# Patient Record
Sex: Male | Born: 1980
Health system: Southern US, Community
[De-identification: ages and names within clinical notes are randomized; demographics above are authoritative.]

## PROBLEM LIST (undated history)

## (undated) DIAGNOSIS — F32A Depression, unspecified: Secondary | ICD-10-CM

## (undated) DIAGNOSIS — F329 Major depressive disorder, single episode, unspecified: Secondary | ICD-10-CM

## (undated) DIAGNOSIS — F909 Attention-deficit hyperactivity disorder, unspecified type: Secondary | ICD-10-CM

## (undated) DIAGNOSIS — Z9852 Vasectomy status: Secondary | ICD-10-CM

## (undated) DIAGNOSIS — F419 Anxiety disorder, unspecified: Secondary | ICD-10-CM

## (undated) DIAGNOSIS — I10 Essential (primary) hypertension: Secondary | ICD-10-CM

## (undated) DIAGNOSIS — T7840XA Allergy, unspecified, initial encounter: Secondary | ICD-10-CM

## (undated) HISTORY — DX: Allergy, unspecified, initial encounter: T78.40XA

## (undated) HISTORY — DX: Attention-deficit hyperactivity disorder, unspecified type: F90.9

## (undated) HISTORY — DX: Anxiety disorder, unspecified: F41.9

## (undated) HISTORY — DX: Essential (primary) hypertension: I10

## (undated) HISTORY — DX: Major depressive disorder, single episode, unspecified: F32.9

## (undated) HISTORY — DX: Depression, unspecified: F32.A

## (undated) HISTORY — PX: VASECTOMY: SHX75

---
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
## 2001-08-19 ENCOUNTER — Emergency Department (HOSPITAL_COMMUNITY): Admission: EM | Admit: 2001-08-19 | Discharge: 2001-08-20 | Payer: Self-pay | Admitting: Emergency Medicine

## 2001-09-06 ENCOUNTER — Emergency Department (HOSPITAL_COMMUNITY): Admission: EM | Admit: 2001-09-06 | Discharge: 2001-09-06 | Payer: Self-pay | Admitting: Emergency Medicine

## 2002-01-08 ENCOUNTER — Encounter: Payer: Self-pay | Admitting: Emergency Medicine

## 2002-01-08 ENCOUNTER — Emergency Department (HOSPITAL_COMMUNITY): Admission: EM | Admit: 2002-01-08 | Discharge: 2002-01-09 | Payer: Self-pay | Admitting: Emergency Medicine

## 2002-01-09 ENCOUNTER — Emergency Department (HOSPITAL_COMMUNITY): Admission: EM | Admit: 2002-01-09 | Discharge: 2002-01-09 | Payer: Self-pay | Admitting: Emergency Medicine

## 2005-08-08 ENCOUNTER — Emergency Department (HOSPITAL_COMMUNITY): Admission: EM | Admit: 2005-08-08 | Discharge: 2005-08-09 | Payer: Self-pay | Admitting: Emergency Medicine

## 2007-10-28 ENCOUNTER — Emergency Department (HOSPITAL_COMMUNITY): Admission: EM | Admit: 2007-10-28 | Discharge: 2007-10-28 | Payer: Self-pay | Admitting: Emergency Medicine

## 2008-07-08 ENCOUNTER — Ambulatory Visit: Payer: Self-pay | Admitting: Family Medicine

## 2008-07-08 DIAGNOSIS — N411 Chronic prostatitis: Secondary | ICD-10-CM

## 2008-07-08 DIAGNOSIS — Z87448 Personal history of other diseases of urinary system: Secondary | ICD-10-CM | POA: Insufficient documentation

## 2008-07-08 HISTORY — DX: Chronic prostatitis: N41.1

## 2008-07-08 LAB — CONVERTED CEMR LAB
Bilirubin Urine: NEGATIVE
Glucose, Urine, Semiquant: NEGATIVE
Ketones, urine, test strip: NEGATIVE
Nitrite: NEGATIVE
Protein, U semiquant: NEGATIVE
Specific Gravity, Urine: 1.015
Urobilinogen, UA: 0.2
WBC Urine, dipstick: NEGATIVE
pH: 7

## 2008-09-02 ENCOUNTER — Ambulatory Visit: Payer: Self-pay | Admitting: Family Medicine

## 2008-09-02 LAB — CONVERTED CEMR LAB
Bilirubin Urine: NEGATIVE
Blood in Urine, dipstick: NEGATIVE
Glucose, Urine, Semiquant: NEGATIVE
Ketones, urine, test strip: NEGATIVE
Nitrite: NEGATIVE
Protein, U semiquant: NEGATIVE
Specific Gravity, Urine: 1.015
Urobilinogen, UA: 0.2
WBC Urine, dipstick: NEGATIVE
pH: 6

## 2009-08-01 ENCOUNTER — Ambulatory Visit: Payer: Self-pay | Admitting: Family Medicine

## 2013-02-04 ENCOUNTER — Emergency Department (HOSPITAL_COMMUNITY)
Admission: EM | Admit: 2013-02-04 | Discharge: 2013-02-04 | Disposition: A | Discharge status: Home / Self Care | Payer: BC Managed Care – PPO | Attending: Emergency Medicine | Admitting: Emergency Medicine

## 2013-02-04 ENCOUNTER — Emergency Department (HOSPITAL_COMMUNITY): Payer: BC Managed Care – PPO

## 2013-02-04 ENCOUNTER — Encounter (HOSPITAL_COMMUNITY): Payer: Self-pay | Admitting: Emergency Medicine

## 2013-02-04 DIAGNOSIS — S139XXA Sprain of joints and ligaments of unspecified parts of neck, initial encounter: Secondary | ICD-10-CM | POA: Insufficient documentation

## 2013-02-04 DIAGNOSIS — M549 Dorsalgia, unspecified: Secondary | ICD-10-CM

## 2013-02-04 DIAGNOSIS — Y939 Activity, unspecified: Secondary | ICD-10-CM | POA: Insufficient documentation

## 2013-02-04 DIAGNOSIS — Y92410 Unspecified street and highway as the place of occurrence of the external cause: Secondary | ICD-10-CM | POA: Insufficient documentation

## 2013-02-04 DIAGNOSIS — S161XXA Strain of muscle, fascia and tendon at neck level, initial encounter: Secondary | ICD-10-CM
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
## 2013-02-05 NOTE — ED Provider Notes (Signed)
Medical screening examination/treatment/procedure(s) were performed by non-physician practitioner and as supervising physician I was immediately available for consultation/collaboration.   Dione Booze, MD 02/05/13 [phone]

## 2017-07-28 ENCOUNTER — Other Ambulatory Visit: Payer: Self-pay

## 2017-07-28 ENCOUNTER — Encounter (HOSPITAL_COMMUNITY): Payer: Self-pay | Admitting: *Deleted

## 2017-07-28 ENCOUNTER — Emergency Department (HOSPITAL_COMMUNITY)
Admission: EM | Admit: 2017-07-28 | Discharge: 2017-07-28 | Disposition: A | Discharge status: Home / Self Care | Payer: 59 | Attending: Emergency Medicine | Admitting: Emergency Medicine

## 2017-07-28 ENCOUNTER — Emergency Department (HOSPITAL_COMMUNITY): Payer: 59

## 2017-07-28 DIAGNOSIS — B349 Viral infection, unspecified: Secondary | ICD-10-CM | POA: Insufficient documentation

## 2017-07-28 DIAGNOSIS — Z87891 Personal history of nicotine dependence: Secondary | ICD-10-CM | POA: Diagnosis not present

## 2017-07-28 DIAGNOSIS — R42 Dizziness and giddiness: Secondary | ICD-10-CM | POA: Diagnosis present

## 2017-07-28 DIAGNOSIS — Z79899 Other long term (current) drug therapy: Secondary | ICD-10-CM | POA: Insufficient documentation

## 2017-07-28 DIAGNOSIS — M545 Low back pain: Secondary | ICD-10-CM | POA: Diagnosis not present

## 2017-07-28 LAB — CBC WITH DIFFERENTIAL/PLATELET
Basophils Absolute: 0 10*3/uL (ref 0.0–0.1)
Basophils Relative: 1 %
EOS PCT: 2 %
Eosinophils Absolute: 0.2 10*3/uL (ref 0.0–0.7)
HCT: 44.8 % (ref 39.0–52.0)
Hemoglobin: 15.3 g/dL (ref 13.0–17.0)
LYMPHS ABS: 1.8 10*3/uL (ref 0.7–4.0)
LYMPHS PCT: 24 %
MCH: 29.7 pg (ref 26.0–34.0)
MCHC: 34.2 g/dL (ref 30.0–36.0)
MCV: 87 fL (ref 78.0–100.0)
MONO ABS: 0.3 10*3/uL (ref 0.1–1.0)
Monocytes Relative: 5 %
Neutro Abs: 4.9 10*3/uL (ref 1.7–7.7)
Neutrophils Relative %: 68 %
PLATELETS: 299 10*3/uL (ref 150–400)
RBC: 5.15 MIL/uL (ref 4.22–5.81)
RDW: 12.6 % (ref 11.5–15.5)
WBC: 7.2 10*3/uL (ref 4.0–10.5)

## 2017-07-28 LAB — COMPREHENSIVE METABOLIC PANEL
ALT: 34 U/L (ref 17–63)
ANION GAP: 9 (ref 5–15)
AST: 24 U/L (ref 15–41)
Albumin: 4.7 g/dL (ref 3.5–5.0)
Alkaline Phosphatase: 59 U/L (ref 38–126)
BUN: 12 mg/dL (ref 6–20)
CHLORIDE: 102 mmol/L (ref 101–111)
CO2: 28 mmol/L (ref 22–32)
CREATININE: 1.13 mg/dL (ref 0.61–1.24)
Calcium: 9.3 mg/dL (ref 8.9–10.3)
Glucose, Bld: 97 mg/dL (ref 65–99)
POTASSIUM: 3.8 mmol/L (ref 3.5–5.1)
SODIUM: 139 mmol/L (ref 135–145)
Total Bilirubin: 0.6 mg/dL (ref 0.3–1.2)
Total Protein: 7.8 g/dL (ref 6.5–8.1)

## 2017-07-28 LAB — URINALYSIS, ROUTINE W REFLEX MICROSCOPIC
BILIRUBIN URINE: NEGATIVE
Bacteria, UA: NONE SEEN
Glucose, UA: NEGATIVE mg/dL
HGB URINE DIPSTICK: NEGATIVE
Ketones, ur: NEGATIVE mg/dL
LEUKOCYTES UA: NEGATIVE
NITRITE: NEGATIVE
PROTEIN: 30 mg/dL — AB
RBC / HPF: NONE SEEN RBC/hpf (ref 0–5)
Specific Gravity, Urine: 1.026 (ref 1.005–1.030)
Squamous Epithelial / LPF: NONE SEEN
WBC UA: NONE SEEN WBC/hpf (ref 0–5)
pH: 6 (ref 5.0–8.0)

## 2017-07-28 MED ORDER — NAPROXEN 500 MG PO TABS: ORAL_TABLET | ORAL | 0 refills | Status: DC

## 2017-07-28 MED ORDER — ACETAMINOPHEN 500 MG PO TABS
ORAL_TABLET | ORAL | Status: AC
  Filled 2017-07-28: qty 2

## 2017-07-28 MED ORDER — MECLIZINE HCL 25 MG PO TABS: 25.0000 mg | ORAL_TABLET | Freq: Three times a day (TID) | ORAL | 0 refills | Status: DC | PRN

## 2017-07-28 MED ORDER — ACETAMINOPHEN 500 MG PO TABS
1000.0000 mg | ORAL_TABLET | Freq: Once | ORAL | Status: AC
  Administered 2017-07-28: 1000 mg via ORAL

## 2017-07-28 NOTE — ED Triage Notes (Addendum)
Pt c/o intermittent lower back pain, dizziness, nausea, cold sweats x 2 days ago. Denies urinary symptoms, vomiting.

## 2017-07-28 NOTE — ED Notes (Signed)
Pt c/o headache.  Dr. Roderic Palau notified.  Orders received.

## 2017-07-28 NOTE — ED Provider Notes (Signed)
[facility] EMERGENCY DEPARTMENT Provider Note   CSN: [id number] Arrival date & time: 07/28/17  1205     History   Chief Complaint Chief Complaint  Patient presents with  . Dizziness  . Back Pain    HPI Alejandro Johnson is a 36 y.o. male.  Patient complains of feeling dizzy when he moves his head from side to side also complains of just some weakness.  No nausea no vomiting no fever.  Patient also has mild back pain   The history is provided by the patient.  Dizziness  Quality:  Lightheadedness and vertigo Severity:  Mild Onset quality:  Sudden Timing:  Intermittent Progression:  Unchanged Chronicity:  New Context: not when bending over   Relieved by:  Nothing Worsened by:  Nothing Associated symptoms: no chest pain, no diarrhea and no headaches   Back Pain   Pertinent negatives include no chest pain, no headaches and no abdominal pain.    History reviewed. No pertinent past medical history.  Patient Active Problem List   Diagnosis Date Noted  . CHRONIC PROSTATITIS 07/08/2008  . DYSURIA, HX OF 07/08/2008    History reviewed. No pertinent surgical history.     Home Medications    Prior to Admission medications   Medication Sig Start Date End Date Taking? Authorizing Provider  amphetamine-dextroamphetamine (ADDERALL) 10 MG tablet Take 10 mg daily with breakfast by mouth.   Yes [provider]  ibuprofen (ADVIL,MOTRIN) 200 MG tablet Take 400 mg every 6 (six) hours as needed by mouth for headache.   Yes [provider]  loratadine (CLARITIN) 10 MG tablet Take 10 mg daily by mouth.   Yes [provider]  meclizine (ANTIVERT) 25 MG tablet Take 1 tablet (25 mg total) 3 (three) times daily as needed by mouth for dizziness. 07/28/17   Milton Ferguson, MD  naproxen (NAPROSYN) 500 MG tablet Take one twice a day as needed for pain 07/28/17   Milton Ferguson, MD    Family History No family history on file.  Social History Social History    Tobacco Use  . Smoking status: Former Research scientist (life sciences)  . Smokeless tobacco: Never Used  Substance Use Topics  . Alcohol use: Yes    Comment: once weekly   . Drug use: No     Allergies   Codeine   Review of Systems Review of Systems  Constitutional: Negative for appetite change and fatigue.  HENT: Negative for congestion, ear discharge and sinus pressure.   Eyes: Negative for discharge.  Respiratory: Negative for cough.   Cardiovascular: Negative for chest pain.  Gastrointestinal: Negative for abdominal pain and diarrhea.  Genitourinary: Negative for frequency and hematuria.  Musculoskeletal: Positive for back pain.  Skin: Negative for rash.  Neurological: Positive for dizziness. Negative for seizures and headaches.  Psychiatric/Behavioral: Negative for hallucinations.     Physical Exam Updated Vital Signs BP 136/88   Pulse 70   Temp 98.4 F (36.9 C) (Oral)   Resp 16   Ht 6' 1.75" (1.873 m)   Wt 93 kg (205 lb)   SpO2 95%   BMI 26.50 kg/m   Physical Exam  Constitutional: He is oriented to person, place, and time. He appears well-developed.  HENT:  Head: Normocephalic.  Eyes: Conjunctivae and EOM are normal. No scleral icterus.  Neck: Neck supple. No thyromegaly present.  Cardiovascular: Normal rate and regular rhythm. Exam reveals no gallop and no friction rub.  No murmur heard. Pulmonary/Chest: No stridor. He has no wheezes. He  has no rales. He exhibits no tenderness.  Abdominal: He exhibits no distension. There is no tenderness. There is no rebound.  Musculoskeletal: Normal range of motion. He exhibits no edema.  Lymphadenopathy:    He has no cervical adenopathy.  Neurological: He is oriented to person, place, and time. He exhibits normal muscle tone. Coordination normal.  Skin: No rash noted. No erythema.  Psychiatric: He has a normal mood and affect. His behavior is normal.     ED Treatments / Results  Labs (all labs ordered are listed, but only abnormal  results are displayed) Labs Reviewed  URINALYSIS, ROUTINE W REFLEX MICROSCOPIC - Abnormal; Notable for the following components:      Result Value   Protein, ur 30 (*)    All other components within normal limits  CBC WITH DIFFERENTIAL/PLATELET  COMPREHENSIVE METABOLIC PANEL    EKG  EKG Interpretation None       Radiology Dg Chest 2 View  Result Date: 07/28/2017 CLINICAL DATA:  Lower back pain. EXAM: CHEST  2 VIEW COMPARISON:  Radiographs of August 08, 2005. FINDINGS: The heart size and mediastinal contours are within normal limits. Both lungs are clear. No pneumothorax or pleural effusion is noted. The visualized skeletal structures are unremarkable. IMPRESSION: No active cardiopulmonary disease. Electronically Signed   By: Marijo Conception, M.D.   On: 07/28/2017 16:55   Dg Lumbar Spine Complete  Result Date: 07/28/2017 CLINICAL DATA:  Low back pain. EXAM: LUMBAR SPINE - COMPLETE 4+ VIEW COMPARISON:  Radiographs of Feb 04, 2013. FINDINGS: There is no evidence of lumbar spine fracture. Alignment is normal. Intervertebral disc spaces are maintained. IMPRESSION: Normal lumbar spine. Electronically Signed   By: Marijo Conception, M.D.   On: 07/28/2017 16:56    Procedures Procedures (including critical care time)  Medications Ordered in ED Medications  acetaminophen (TYLENOL) tablet 1,000 mg (1,000 mg Oral Given 07/28/17 1740)     Initial Impression / Assessment and Plan / ED Course  I have reviewed the triage vital signs and the nursing notes.  Pertinent labs & imaging results that were available during my care of the patient were reviewed by me and considered in my medical decision making (see chart for details).     Labs unremarkable x-rays are unremarkable.  Suspect viral syndrome patient will be given Antivert and Naprosyn and will follow-up next week with a new PCP  Final Clinical Impressions(s) / ED Diagnoses   Final diagnoses:  Viral syndrome    ED Discharge  Orders        Ordered    meclizine (ANTIVERT) 25 MG tablet  3 times daily PRN     07/28/17 1811    naproxen (NAPROSYN) 500 MG tablet     07/28/17 1811       Milton Ferguson, MD 07/28/17 1819

## 2017-07-28 NOTE — Discharge Instructions (Signed)
Follow-up with your doctor as planned Tuesday.

## 2017-08-02 ENCOUNTER — Ambulatory Visit: Payer: Self-pay | Admitting: Family Medicine

## 2017-08-08 ENCOUNTER — Ambulatory Visit: Payer: Self-pay | Admitting: Family Medicine

## 2017-10-03 ENCOUNTER — Ambulatory Visit: Payer: Self-pay | Admitting: Family Medicine

## 2017-10-14 ENCOUNTER — Ambulatory Visit (INDEPENDENT_AMBULATORY_CARE_PROVIDER_SITE_OTHER): Payer: No Typology Code available for payment source | Admitting: Family Medicine

## 2017-10-14 ENCOUNTER — Encounter: Payer: Self-pay | Admitting: Family Medicine

## 2017-10-14 ENCOUNTER — Other Ambulatory Visit: Payer: Self-pay

## 2017-10-14 VITALS — BP 139/91 | HR 73 | Temp 97.7°F | Resp 18 | Ht 71.93 in | Wt 202.0 lb

## 2017-10-14 DIAGNOSIS — F418 Other specified anxiety disorders: Secondary | ICD-10-CM

## 2017-10-14 DIAGNOSIS — Z1329 Encounter for screening for other suspected endocrine disorder: Secondary | ICD-10-CM | POA: Diagnosis not present

## 2017-10-14 MED ORDER — FLUOXETINE HCL 20 MG PO TABS
20.0000 mg | ORAL_TABLET | Freq: Every day | ORAL | 3 refills | Status: DC
Start: 2017-10-14 — End: 2017-11-21

## 2017-10-14 MED FILL — FLUoxetine HCL 20 MG TABS: 20 | 30 days supply | Qty: 30 | Fill #0

## 2017-10-14 NOTE — Patient Instructions (Addendum)
Start fluoxetine once per day. Continue follow-up with therapist and start exercise most days per week. Follow-up in 3-4 weeks, but can update me by my chart in the next 2 weeks. Also can follow-up to discuss the bumps under the skin at next visit.  Return to the clinic or go to the nearest emergency room if any of your symptoms worsen or new symptoms occur.   Major Depressive Disorder, Adult Major depressive disorder (MDD) is a mental health condition. MDD often makes you feel sad, hopeless, or helpless. MDD can also cause symptoms in your body. MDD can affect your:  Work.  School.  Relationships.  Other normal activities.  MDD can range from mild to very bad. It may occur once (single episode MDD). It can also occur many times (recurrent MDD). The main symptoms of MDD often include:  Feeling sad, depressed, or irritable most of the time.  Loss of interest.  MDD symptoms also include:  Sleeping too much or too little.  Eating too much or too little.  A change in your weight.  Feeling tired (fatigue) or having low energy.  Feeling worthless.  Feeling guilty.  Trouble making decisions.  Trouble thinking clearly.  Thoughts of suicide or harming others.  Feeling weak.  Feeling agitated.  Keeping yourself from being around other people (isolation).  Follow these instructions at home: Activity  Do these things as told by your doctor: ? Go back to your normal activities. ? Exercise regularly. ? Spend time outdoors. Alcohol  Talk with your doctor about how alcohol can affect your antidepressant medicines.  Do not drink alcohol. Or, limit how much alcohol you drink. ? This means no more than 1 drink a day for nonpregnant women and 2 drinks a day for men. One drink equals one of these:  12 oz of beer.  5 oz of wine.  1 oz of hard liquor. General instructions  Take over-the-counter and prescription medicines only as told by your doctor.  Eat a healthy  diet.  Get plenty of sleep.  Find activities that you enjoy. Make time to do them.  Think about joining a support group. Your doctor may be able to suggest a group for you.  Keep all follow-up visits as told by your doctor. This is important. Where to find more information:  [employer] on Mental Illness: ? www.nami.[place]: ? https://carter.com/  National Suicide Prevention Lifeline: ? 865-510-7309. This is free, 24-hour help. Contact a doctor if:  Your symptoms get worse.  You have new symptoms. Get help right away if:  You self-harm.  You see, hear, taste, smell, or feel things that are not present (hallucinate). If you ever feel like you may hurt yourself or others, or have thoughts about taking your own life, get help right away. You can go to your nearest emergency department or call:  Your local emergency services (911 in the [place]  A suicide crisis helpline, such as the National Suicide Prevention Lifeline: ? 501-836-3670. This is open 24 hours a day.  This information is not intended to replace advice given to you by your health care provider. Make sure you discuss any questions you have with your health care provider. Document Released: 08/11/2015 Document Revised: 05/16/2016 Document Reviewed: 05/16/2016 Elsevier Interactive Patient Education  2017 [employer].   IF you received an x-ray today, you will receive an invoice from [facility]. Please contact [facility] at [phone] with questions or concerns regarding your  invoice.   IF you received labwork today, you will receive an invoice from [facility]. Please contact [facility] at [phone] with questions or concerns regarding your invoice.   Our billing staff will not be able to assist you with questions regarding bills from these companies.  You will be contacted with the lab results as soon as they are available. The fastest way to get  your results is to activate your My Chart account. Instructions are located on the last page of this paperwork. If you have not heard from [place] regarding the results in 2 weeks, please contact this office.

## 2017-10-14 NOTE — Progress Notes (Addendum)
Subjective:    Patient ID: Alejandro R Meter, male    DOB: 1980/12/27, 37 y.o.   MRN: [id number]  HPI Alejandro R Jarmon is a 37 y.o. male Presents today for: Chief Complaint  Patient presents with  . Establish Care  . Depression    screening done and anxiety   Here to establish care. Change in insurance. No current prescription meds.   Positive depression screening on intake including suicidal ideation as below.  Has been seeing therapist for past 2-3 months. [facility].  Has been diagnosed with depression and anxiety , with self esteem issues.  Took Zoloft for these sx's in 2015. On and off that med in past, but did not feel like it was that helpful. No suicide attempts. Has had thoughts of suicide prior to meeting with therapist, but no plan.  No recent suicide thoughts. No recent exercise, prior basketball few times per week in the fall. One close friend in the area. Family local in [place]. Barrier to counseling previously was initially financially, but current counseling is covered.   No recent ADD meds. No formal eval.   No known Bipolar D/O in family. Mom with depression - unknown   Legal issues between [age]. 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 Last used 2003-2004. No recent alcohol - prior up to one bottle of wine per week. No issues with alcohol recently.   Works from home - [employer]. Married, recenlty found out wife is pregnant, surprise but ok with this. Has 3 and 38 yo boys.    Depression screen PHQ 2/9 10/14/2017  Decreased Interest 3  Down, Depressed, Hopeless 3  PHQ - 2 Score 6  Altered sleeping 3  Tired, decreased energy 3  Change in appetite 1  Feeling bad or failure about yourself  3  Trouble concentrating 3  Moving slowly or fidgety/restless 1  Suicidal thoughts 2  PHQ-9 Score 22  Difficult doing work/chores Very difficult      Patient Active Problem List   Diagnosis Date Noted  . CHRONIC PROSTATITIS 07/08/2008  . DYSURIA, HX OF 07/08/2008   Past Medical  History:  Diagnosis Date  . Allergy   . Anxiety   . Depression    History reviewed. No pertinent surgical history. Allergies  Allergen Reactions  . Codeine Itching and Rash   Prior to Admission medications   Medication Sig Start Date End Date Taking? Authorizing Provider  ibuprofen (ADVIL,MOTRIN) 200 MG tablet Take 400 mg every 6 (six) hours as needed by mouth for headache.   Yes [provider]  loratadine (CLARITIN) 10 MG tablet Take 10 mg daily by mouth.   Yes [provider]  amphetamine-dextroamphetamine (ADDERALL) 10 MG tablet Take 10 mg daily with breakfast by mouth.    [provider]  meclizine (ANTIVERT) 25 MG tablet Take 1 tablet (25 mg total) 3 (three) times daily as needed by mouth for dizziness. Patient not taking: Reported on 10/14/2017 07/28/17   Milton Ferguson, MD  naproxen (NAPROSYN) 500 MG tablet Take one twice a day as needed for pain Patient not taking: Reported on 10/14/2017 07/28/17   Milton Ferguson, MD   Social History   Socioeconomic History  . Marital status: Married    Spouse name: [name]  . Number of children: 2  . Years of education: Not on file  . Highest education level: Not on file  Social Needs  . Financial resource strain: Not on file  . Food insecurity - worry: Not on file  .  Food insecurity - inability: Not on file  . Transportation needs - medical: Not on file  . Transportation needs - non-medical: Not on file  Occupational History  . Not on file  Tobacco Use  . Smoking status: Former Smoker    Types: Cigarettes  . Smokeless tobacco: Never Used  Substance and Sexual Activity  . Alcohol use: Yes    Comment: once weekly   . Drug use: No  . Sexual activity: Yes  Other Topics Concern  . Not on file  Social History Narrative  . Not on file    Review of Systems    As above.  Objective:   Physical Exam  Constitutional: He is oriented to person, place, and time. He appears well-developed and well-nourished.    HENT:  Head: Normocephalic and atraumatic.  Eyes: EOM are normal. Pupils are equal, round, and reactive to light.  Neck: No JVD present. Carotid bruit is not present.  Cardiovascular: Normal rate, regular rhythm and normal heart sounds.  No murmur heard. Pulmonary/Chest: Effort normal and breath sounds normal. He has no rales.  Musculoskeletal: He exhibits no edema.  Neurological: He is alert and oriented to person, place, and time.  Skin: Skin is warm and dry.  Psychiatric: He has a normal mood and affect. His behavior is normal. Thought content normal.  Good eye contact, normal responses.  Vitals reviewed.   Vitals:   10/14/17 1102  BP: (!) 140/95  Pulse: 73  Resp: 18  Temp: 97.7 F (36.5 C)  TempSrc: Oral  SpO2: 98%  Weight: 202 lb (91.6 kg)  Height: 5' 11.93" (1.827 m)    Over 20 minutes total face-to-face care, greater than 50% counseling     Assessment & Plan:    Alejandro Johnson is a 37 y.o. male Depression with anxiety - Plan: TSH, FLUoxetine (PROZAC) 20 MG tablet  Screening for thyroid disorder - Plan: TSH Suspect long-standing depression, recent worsening but now with care of therapist. Denies recent suicidal ideation. ER/911 precautions discussed if return of those symptoms.   - Start Prozac 20 mg daily potential side effects discussed, continue therapy, exercise most days per week, recheck 3-4 weeks.  At end of visit he has some concerns regarding long-standing bumps under the skin. Plans to follow-up to discuss this further.  Meds ordered this encounter  Medications  . FLUoxetine (PROZAC) 20 MG tablet    Sig: Take 1 tablet (20 mg total) by mouth daily.    Dispense:  30 tablet    Refill:  3   Patient Instructions   Start fluoxetine once per day. Continue follow-up with therapist and start exercise most days per week. Follow-up in 3-4 weeks, but can update me by my chart in the next 2 weeks. Also can follow-up to discuss the bumps under the skin at next  visit.  Return to the clinic or go to the nearest emergency room if any of your symptoms worsen or new symptoms occur.   Major Depressive Disorder, Adult Major depressive disorder (MDD) is a mental health condition. MDD often makes you feel sad, hopeless, or helpless. MDD can also cause symptoms in your body. MDD can affect your:  Work.  School.  Relationships.  Other normal activities.  MDD can range from mild to very bad. It may occur once (single episode MDD). It can also occur many times (recurrent MDD). The main symptoms of MDD often include:  Feeling sad, depressed, or irritable most of the time.  Loss of interest.  MDD symptoms also include:  Sleeping too much or too little.  Eating too much or too little.  A change in your weight.  Feeling tired (fatigue) or having low energy.  Feeling worthless.  Feeling guilty.  Trouble making decisions.  Trouble thinking clearly.  Thoughts of suicide or harming others.  Feeling weak.  Feeling agitated.  Keeping yourself from being around other people (isolation).  Follow these instructions at home: Activity  Do these things as told by your doctor: ? Go back to your normal activities. ? Exercise regularly. ? Spend time outdoors. Alcohol  Talk with your doctor about how alcohol can affect your antidepressant medicines.  Do not drink alcohol. Or, limit how much alcohol you drink. ? This means no more than 1 drink a day for nonpregnant women and 2 drinks a day for men. One drink equals one of these:  12 oz of beer.  5 oz of wine.  1 oz of hard liquor. General instructions  Take over-the-counter and prescription medicines only as told by your doctor.  Eat a healthy diet.  Get plenty of sleep.  Find activities that you enjoy. Make time to do them.  Think about joining a support group. Your doctor may be able to suggest a group for you.  Keep all follow-up visits as told by your doctor. This is  important. Where to find more information:  [employer] on Mental Illness: ? www.nami.[place]: ? https://carter.com/  National Suicide Prevention Lifeline: ? 640-356-2884. This is free, 24-hour help. Contact a doctor if:  Your symptoms get worse.  You have new symptoms. Get help right away if:  You self-harm.  You see, hear, taste, smell, or feel things that are not present (hallucinate). If you ever feel like you may hurt yourself or others, or have thoughts about taking your own life, get help right away. You can go to your nearest emergency department or call:  Your local emergency services (911 in the [place]  A suicide crisis helpline, such as the National Suicide Prevention Lifeline: ? (772) 059-8774. This is open 24 hours a day.  This information is not intended to replace advice given to you by your health care provider. Make sure you discuss any questions you have with your health care provider. Document Released: 08/11/2015 Document Revised: 05/16/2016 Document Reviewed: 05/16/2016 Elsevier Interactive Patient Education  2017 [employer].   IF you received an x-ray today, you will receive an invoice from [facility] Radiology. Please contact [facility] Radiology at [phone] with questions or concerns regarding your invoice.   IF you received labwork today, you will receive an invoice from [facility]. Please contact [facility] at [phone] with questions or concerns regarding your invoice.   Our billing staff will not be able to assist you with questions regarding bills from these companies.  You will be contacted with the lab results as soon as they are available. The fastest way to get your results is to activate your My Chart account. Instructions are located on the last page of this paperwork. If you have not heard from [place] regarding the results in 2 weeks, please contact this office.      Signed,   [name], MD Primary Care at [place].  10/14/17 12:10 PM

## 2017-10-14 NOTE — Addendum Note (Signed)
Addended by: [name] R on: 10/14/2017 12:13 PM   Modules accepted: Orders

## 2017-10-15 LAB — TSH: TSH: 1.49 u[IU]/mL (ref 0.450–4.500)

## 2017-11-14 ENCOUNTER — Ambulatory Visit: Payer: No Typology Code available for payment source | Admitting: Family Medicine

## 2017-11-21 ENCOUNTER — Encounter: Payer: Self-pay | Admitting: Family Medicine

## 2017-11-21 ENCOUNTER — Other Ambulatory Visit: Payer: Self-pay

## 2017-11-21 ENCOUNTER — Ambulatory Visit (INDEPENDENT_AMBULATORY_CARE_PROVIDER_SITE_OTHER): Payer: No Typology Code available for payment source | Admitting: Family Medicine

## 2017-11-21 DIAGNOSIS — F418 Other specified anxiety disorders: Secondary | ICD-10-CM | POA: Diagnosis not present

## 2017-11-21 MED ORDER — FLUOXETINE HCL 20 MG PO TABS
40.0000 mg | ORAL_TABLET | Freq: Every day | ORAL | 1 refills | Status: DC
Start: 2017-11-21 — End: 2018-02-17

## 2017-11-21 MED FILL — FLUoxetine HCL 40 MG CAPS: 40 | 90 days supply | Qty: 90 | Fill #0

## 2017-11-21 NOTE — Patient Instructions (Addendum)
Keep follow up with therapist.   As cough improves, restart exercise to also help with depression.  Increase Prozac to 40mg  per day.  Recheck in 3 months - let me know if there are questions in the meantime. Thanks for coming in today.    IF you received an x-ray today, you will receive an invoice from [facility] Radiology. Please contact [facility] Radiology at [phone] with questions or concerns regarding your invoice.   IF you received labwork today, you will receive an invoice from [place]. Please contact [facility] at [phone] with questions or concerns regarding your invoice.   Our billing staff will not be able to assist you with questions regarding bills from these companies.  You will be contacted with the lab results as soon as they are available. The fastest way to get your results is to activate your My Chart account. Instructions are located on the last page of this paperwork. If you have not heard from [place] regarding the results in 2 weeks, please contact this office.

## 2017-11-21 NOTE — Progress Notes (Addendum)
Subjective:  This chart was scribed for Wendie Agreste, MD by Tamsen Roers, at [place] at [facility].  This patient was seen in room 11  and the patient's care was started at 10:23 AM.   Chief Complaint  Patient presents with  . Depression    follow up screening was an 63      Patient ID: Alejandro Johnson, male    DOB: July 28, 1981, 37 y.o.   MRN: [id number]  HPI HPI Comments: Alejandro Johnson is a 37 y.o. male who presents to Primary Care at [facility] for follow up on depression.  Last seen February 1st. Started on Prozac 20 mg QD. Recommended follow up with therapist and exercise most days of the week. See other details from that visit. Normal TSH at last visit.  --- Patient has not noticed much change after taking the medication and denies any side effects. He denies any thoughts of suicide as he has been talking often with his therapist and wife (was talking to his therapist about twice a week) who have been helping him with these thoughts. He has had the flu (still has a cough) so he was not able to meet with the therapist for a couple days or exercise as he was planning on doing.  Patients wife is currently pregnant.   Depression screen [facility] 2/9 11/21/2017 10/14/2017  Decreased Interest 3 3  Down, Depressed, Hopeless 3 3  PHQ - 2 Score 6 6  Altered sleeping 2 3  Tired, decreased energy 2 3  Change in appetite 0 1  Feeling bad or failure about yourself  2 3  Trouble concentrating 3 3  Moving slowly or fidgety/restless 3 1  Suicidal thoughts 0 2  PHQ-9 Score 18 22  Difficult doing work/chores - Very difficult  PHQ decreased to 18 today from prior visit.   Patient has "bumps" on his arms and inner part of his legs.  He plans on coming in for this at a later time as he is not concerned about them and present for some time.   Patient Active Problem List   Diagnosis Date Noted  . CHRONIC PROSTATITIS 07/08/2008  . DYSURIA, HX OF 07/08/2008   Past Medical History:  Diagnosis Date  .  Allergy   . Anxiety   . Depression    No past surgical history on file. Allergies  Allergen Reactions  . Codeine Itching and Rash   Prior to Admission medications   Medication Sig Start Date End Date Taking? Authorizing Provider  FLUoxetine (PROZAC) 20 MG tablet Take 1 tablet (20 mg total) by mouth daily. 10/14/17   Wendie Agreste, MD  ibuprofen (ADVIL,MOTRIN) 200 MG tablet Take 400 mg every 6 (six) hours as needed by mouth for headache.    [provider]  loratadine (CLARITIN) 10 MG tablet Take 10 mg daily by mouth.    [provider]   Social History   Socioeconomic History  . Marital status: Married    Spouse name: [name]  . Number of children: 2  . Years of education: Not on file  . Highest education level: Not on file  Social Needs  . Financial resource strain: Not on file  . Food insecurity - worry: Not on file  . Food insecurity - inability: Not on file  . Transportation needs - medical: Not on file  . Transportation needs - non-medical: Not on file  Occupational History  . Not on file  Tobacco Use  . Smoking  status: Former Smoker    Types: Cigarettes  . Smokeless tobacco: Never Used  Substance and Sexual Activity  . Alcohol use: Yes    Comment: once weekly   . Drug use: No  . Sexual activity: Yes  Other Topics Concern  . Not on file  Social History Narrative  . Not on file      Review of Systems  Constitutional: Negative for chills and fever.  Eyes: Negative for pain, redness and itching.  Respiratory: Negative for choking and shortness of breath.   Gastrointestinal: Negative for nausea and vomiting.  Neurological: Negative for speech difficulty.  Psychiatric/Behavioral: Positive for dysphoric mood.       Objective:   Physical Exam  Constitutional: He is oriented to person, place, and time. He appears well-developed and well-nourished. No distress.  HENT:  Head: Normocephalic and atraumatic.  Pulmonary/Chest: Effort normal. No  respiratory distress.  Neurological: He is alert and oriented to person, place, and time.  Skin: Skin is warm and dry.  Psychiatric: He has a normal mood and affect. His behavior is normal. Judgment and thought content normal.  Vitals reviewed.  Vitals:   Vitals:   11/21/17 1006  BP: (!) 150/98  Pulse: 82  Resp: 18  Temp: 98.4 F (36.9 C)  TempSrc: Oral  SpO2: 98%  Weight: 196 lb 3.2 oz (89 kg)  Height: 5' 11.93" (1.827 m)       Assessment & Plan:   Alejandro Johnson is a 37 y.o. male Depression with anxiety - Plan: FLUoxetine (PROZAC) 20 MG tablet  - min improvement, but tolerating prozac. Increase to 40mg  qd.   -restart exercise as cough improves.   -continue to meet with therapist.   - borderline elevated BP last visit, monitor at recheck to decide on meds - may be related to SSRI or recent cough. Plans to follow up for other nonurgent related issues above.   Meds ordered this encounter  Medications  . FLUoxetine (PROZAC) 20 MG tablet    Sig: Take 2 tablets (40 mg total) by mouth daily.    Dispense:  180 tablet    Refill:  1   Patient Instructions   Keep follow up with therapist.   As cough improves, restart exercise to also help with depression.  Increase Prozac to 40mg  per day.  Recheck in 3 months - let me know if there are questions in the meantime. Thanks for coming in today.    IF you received an x-ray today, you will receive an invoice from [facility] Radiology. Please contact [facility] Radiology at [phone] with questions or concerns regarding your invoice.   IF you received labwork today, you will receive an invoice from [facility]. Please contact [facility] at [phone] with questions or concerns regarding your invoice.   Our billing staff will not be able to assist you with questions regarding bills from these companies.  You will be contacted with the lab results as soon as they are available. The fastest way to get your results is to activate your My  Chart account. Instructions are located on the last page of this paperwork. If you have not heard from [place] regarding the results in 2 weeks, please contact this office.      I personally performed the services described in this documentation, which was scribed in my presence. The recorded information has been reviewed and considered for accuracy and completeness, addended by me as needed, and agree with information above.  Signed,   [name], MD  Primary Care at [place].  11/23/17 9:54 PM

## 2017-11-23 ENCOUNTER — Encounter: Payer: Self-pay | Admitting: Family Medicine

## 2017-12-15 ENCOUNTER — Encounter: Payer: Self-pay | Admitting: Family Medicine

## 2018-01-26 MED FILL — VYVANSE 20 MG CAPSULE: 20 | 30 days supply | Qty: 30 | Fill #0

## 2018-02-08 MED FILL — VYVANSE 40 MG CAPSULE: 40 | 30 days supply | Qty: 30 | Fill #0

## 2018-02-10 ENCOUNTER — Encounter: Payer: No Typology Code available for payment source | Admitting: Family Medicine

## 2018-02-11 DIAGNOSIS — F909 Attention-deficit hyperactivity disorder, unspecified type: Secondary | ICD-10-CM | POA: Insufficient documentation

## 2018-02-17 ENCOUNTER — Ambulatory Visit (INDEPENDENT_AMBULATORY_CARE_PROVIDER_SITE_OTHER): Payer: No Typology Code available for payment source | Admitting: Family Medicine

## 2018-02-17 ENCOUNTER — Encounter: Payer: Self-pay | Admitting: Family Medicine

## 2018-02-17 ENCOUNTER — Other Ambulatory Visit: Payer: Self-pay

## 2018-02-17 VITALS — BP 122/70 | HR 96 | Temp 98.0°F | Resp 16 | Ht 73.23 in | Wt 202.0 lb

## 2018-02-17 DIAGNOSIS — Z13220 Encounter for screening for lipoid disorders: Secondary | ICD-10-CM

## 2018-02-17 DIAGNOSIS — F418 Other specified anxiety disorders: Secondary | ICD-10-CM

## 2018-02-17 DIAGNOSIS — Z130 Encounter for screening for diseases of the blood and blood-forming organs and certain disorders involving the immune mechanism: Secondary | ICD-10-CM | POA: Diagnosis not present

## 2018-02-17 DIAGNOSIS — Z114 Encounter for screening for human immunodeficiency virus [HIV]: Secondary | ICD-10-CM

## 2018-02-17 DIAGNOSIS — Z0000 Encounter for general adult medical examination without abnormal findings: Secondary | ICD-10-CM

## 2018-02-17 DIAGNOSIS — Z23 Encounter for immunization: Secondary | ICD-10-CM

## 2018-02-17 DIAGNOSIS — Z131 Encounter for screening for diabetes mellitus: Secondary | ICD-10-CM

## 2018-02-17 MED ORDER — FLUOXETINE HCL 40 MG PO CAPS: 40.0000 mg | ORAL_CAPSULE | Freq: Every day | ORAL | 1 refills | Status: DC

## 2018-02-17 MED ORDER — FLUOXETINE HCL 20 MG PO TABS: 20.0000 mg | ORAL_TABLET | Freq: Every day | ORAL | 1 refills | Status: DC

## 2018-02-17 MED FILL — FLUoxetine HCL 20 MG TABS: 20 | 90 days supply | Qty: 90 | Fill #0

## 2018-02-17 MED FILL — FLUoxetine HCL 40 MG CAPS: 40 | 90 days supply | Qty: 90 | Fill #0

## 2018-02-17 NOTE — Patient Instructions (Addendum)
I'm glad to hear that depression and anxiety symptoms are improving. You can try increasing prozac to 60mg  per day (40mg  and 20mg  each day) and then follow up in the next 3 months to see how that dose is doing.   I will check some fasting labwork today, but if those levels are elevated, may need to be repeated for 8 hours.    Keeping you healthy  Get these tests  Blood pressure- Have your blood pressure checked once a year by your healthcare provider.  Normal blood pressure is 120/80.  Weight- Have your body mass index (BMI) calculated to screen for obesity.  BMI is a measure of body fat based on height and weight. You can also calculate your own BMI at GravelBags.it.  Cholesterol- Have your cholesterol checked regularly starting at age 17, sooner may be necessary if you have diabetes, high blood pressure, if a family member developed heart diseases at an early age or if you smoke.   Chlamydia, HIV, and other sexual transmitted disease- Get screened each year until the age of 65 then within three months of each new sexual partner.  Diabetes- Have your blood sugar checked regularly if you have high blood pressure, high cholesterol, a family history of diabetes or if you are overweight.  Get these vaccines  Flu shot- Every fall.  Tetanus shot- Every 10 years.  Menactra- Single dose; prevents meningitis.  Take these steps  Don't smoke- If you do smoke, ask your healthcare provider about quitting. For tips on how to quit, go to www.smokefree.gov or call 1-800-QUIT-NOW.  Be physically active- Exercise 5 days a week for at least 30 minutes.  If you are not already physically active start slow and gradually work up to 30 minutes of moderate physical activity.  Examples of moderate activity include walking briskly, mowing the yard, dancing, swimming bicycling, etc.  Eat a healthy diet- Eat a variety of healthy foods such as fruits, vegetables, low fat milk, low fat cheese,  yogurt, lean meats, poultry, fish, beans, tofu, etc.  For more information on healthy eating, go to www.thenutritionsource.org  Drink alcohol in moderation- Limit alcohol intake two drinks or less a day.  Never drink and drive.  Dentist- Brush and floss teeth twice daily; visit your dentis twice a year.  Depression-Your emotional health is as important as your physical health.  If you're feeling down, losing interest in things you normally enjoy please talk with your healthcare provider.  Gun Safety- If you keep a gun in your home, keep it unloaded and with the safety lock on.  Bullets should be stored separately.  Helmet use- Always wear a helmet when riding a motorcycle, bicycle, rollerblading or skateboarding.  Safe sex- If you may be exposed to a sexually transmitted infection, use a condom  Seat belts- Seat bels can save your life; always wear one.  Smoke/Carbon Monoxide detectors- These detectors need to be installed on the appropriate level of your home.  Replace batteries at least once a year.  Skin Cancer- When out in the sun, cover up and use sunscreen SPF 15 or higher.  Violence- If anyone is threatening or hurting you, please tell your healthcare provider.    IF you received an x-ray today, you will receive an invoice from [facility] Radiology. Please contact [facility] Radiology at [phone] with questions or concerns regarding your invoice.   IF you received labwork today, you will receive an invoice from [facility]. Please contact [facility] at [phone] with questions or concerns  regarding your invoice.   Our billing staff will not be able to assist you with questions regarding bills from these companies.  You will be contacted with the lab results as soon as they are available. The fastest way to get your results is to activate your My Chart account. Instructions are located on the last page of this paperwork. If you have not heard from [place] regarding the results in 2  weeks, please contact this office.

## 2018-02-17 NOTE — Progress Notes (Signed)
Subjective:  By signing my name below, I, Alejandro Johnson, attest that this documentation has been prepared under the direction and in the presence of Alejandro Ray, MD. Electronically Signed: Moises Johnson, [place]. 02/17/2018 , 2:11 PM .  Patient was seen in Room 1 .   Patient ID: Alejandro Johnson, male    DOB: 1981/04/26, 37 y.o.   MRN: [id number] Chief Complaint  Patient presents with  . Annual Exam   HPI Alejandro Johnson is a 37 y.o. male Here for annual physical. Patient was last seen in March with depression/anxiety. Last meal was last night, but he did have a protein shake this morning at 10:30 AM.   His wife is due with a baby girl in Sept. He has a 28 year old and a 38 year old.   Depression/anxiety He was initially seen on Feb 1st, started on Prozac 20 mg qd, and then increased to 40 mg qd. Patient states he's been taking Prozac 40 mg qd since March, which has helped him a lot. He denies any side effects including erectile dysfunction, or manic symptoms. It has made him increased general positivity; no more suicidal thoughts and anxiety has been coming down. Although, he feels like there's another step up, because he had a step up from 20 mg to 40 mg. He's currently paused session with therapist, Dr. Deneise Lever, because he's gotten into a good place. He also went to [facility], diagnosed with ADHD; currently taking Vyvanse for about 6 weeks now.   Seasonal allergies He takes OTC medication, Claritin, for seasonal allergies.   Elevated BP BP Readings from Last 3 Encounters:  02/17/18 122/70  11/21/17 (!) 150/98  10/14/17 (!) 139/91   Lab Results  Component Value Date   CREATININE 1.13 07/28/2017    Cancer Screening Family history: Paternal grandmother (skin[place] and paternal grandfather (lung, 7 year smoker).  His first cousin had leukemia, died in his 20 years.   Immunizations  There is no immunization history on file for this  patient.  Tdap: updated today.   Depression Depression screen [facility] 2/9 02/17/2018 11/21/2017 10/14/2017  Decreased Interest 0 3 3  Down, Depressed, Hopeless 0 3 3  PHQ - 2 Score 0 6 6  Altered sleeping - 2 3  Tired, decreased energy - 2 3  Change in appetite - 0 1  Feeling bad or failure about yourself  - 2 3  Trouble concentrating - 3 3  Moving slowly or fidgety/restless - 3 1  Suicidal thoughts - 0 2  PHQ-9 Score - 18 22  Difficult doing work/chores - - Very difficult   Treated as above.   Vision  Visual Acuity Screening   Right eye Left eye Both eyes  Without correction: 20/20 20/20 20/20   With correction:        He denies wearing contacts or glasses. He has never seen an eye doctor.   Dentist He hasn't seen a dentist in 5 years.   Screening lab work Normal thyroid on Feb 1st.  He declines need for STI testing.  HIV screening: added to Johnson work today.   Patient Active Problem List   Diagnosis Date Noted  . CHRONIC PROSTATITIS 07/08/2008  . DYSURIA, HX OF 07/08/2008   Past Medical History:  Diagnosis Date  . Allergy   . Anxiety   . Depression    History reviewed. No pertinent surgical history. Allergies  Allergen Reactions  . Codeine Itching and Rash  Prior to Admission medications   Medication Sig Start Date End Date Taking? Authorizing Provider  FLUoxetine (PROZAC) 20 MG tablet Take 2 tablets (40 mg total) by mouth daily. 11/21/17   Wendie Agreste, MD  ibuprofen (ADVIL,MOTRIN) 200 MG tablet Take 400 mg every 6 (six) hours as needed by mouth for headache.    [provider]  loratadine (CLARITIN) 10 MG tablet Take 10 mg daily by mouth.    [provider]   Social History   Socioeconomic History  . Marital status: Married    Spouse name: Alejandro Johnson  . Number of children: 2  . Years of education: Not on file  . Highest education level: Not on file  Occupational History  . Not on file  Social Needs  . Financial resource strain: Not  on file  . Food insecurity:    Worry: Not on file    Inability: Not on file  . Transportation needs:    Medical: Not on file    Non-medical: Not on file  Tobacco Use  . Smoking status: Former Smoker    Types: Cigarettes  . Smokeless tobacco: Never Used  Substance and Sexual Activity  . Alcohol use: Yes    Comment: once weekly   . Drug use: No  . Sexual activity: Yes  Lifestyle  . Physical activity:    Days per week: Not on file    Minutes per session: Not on file  . Stress: Not on file  Relationships  . Social connections:    Talks on phone: Not on file    Gets together: Not on file    Attends religious service: Not on file    Active member of club or organization: Not on file    Attends meetings of clubs or organizations: Not on file    Relationship status: Not on file  . Intimate partner violence:    Fear of current or ex partner: Not on file    Emotionally abused: Not on file    Physically abused: Not on file    Forced sexual activity: Not on file  Other Topics Concern  . Not on file  Social History Narrative  . Not on file   Review of Systems 13 point ROS - positive for seasonal allergies, agitation, sad mood, anxiousness     Objective:   Physical Exam  Constitutional: He is oriented to person, place, and time. He appears well-developed and well-nourished.  HENT:  Head: Normocephalic and atraumatic.  Right Ear: External ear normal.  Left Ear: External ear normal.  Mouth/Throat: Oropharynx is clear and moist.  Eyes: Pupils are equal, round, and reactive to light. Conjunctivae and EOM are normal.  Neck: Normal range of motion. Neck supple. No thyromegaly present.  Cardiovascular: Normal rate, regular rhythm, normal heart sounds and intact distal pulses.  Pulmonary/Chest: Effort normal and breath sounds normal. No respiratory distress. He has no wheezes.  Abdominal: Soft. He exhibits no distension. There is no tenderness.  Musculoskeletal: Normal range of  motion. He exhibits no edema or tenderness.  Lymphadenopathy:    He has no cervical adenopathy.  Neurological: He is alert and oriented to person, place, and time. He has normal reflexes.  Skin: Skin is warm and dry.  Psychiatric: He has a normal mood and affect. His behavior is normal.  Vitals reviewed.   Vitals:   02/17/18 1349  BP: 122/70  Pulse: 96  Resp: 16  Temp: 98 F (36.7 C)  TempSrc: Oral  SpO2: 96%  Weight: 202 lb (91.6 kg)  Height: 6' 1.23" (1.86 m)       Assessment & Plan:   Alejandro R Zolman is a 37 y.o. male Annual physical exam  - -anticipatory guidance as below in AVS, screening labs above. Health maintenance items as above in HPI discussed/recommended as applicable.   Need for Tdap vaccination - Plan: Tdap vaccine greater than or equal to 7yo IM  - tdap updated.   Screening, anemia, deficiency, iron - Plan: CBC  Screening for hyperlipidemia - Plan: Lipid panel  Screening for diabetes mellitus - Plan: Comprehensive metabolic panel  Encounter for screening for HIV - Plan: HIV antibody  Depression with anxiety - Plan: FLUoxetine (PROZAC) 20 MG tablet, FLUoxetine (PROZAC) 40 MG capsule  -Improved on 40 mg dose, does still have some dips of depression and would like to try 60 mg dosing.  Has not had any new side effects that he knows of at 40 mg.  Potential sexual side effects, other side effects were discussed.  We will try to increase to 60 mg daily, recheck in 3 months.  Continue follow-up with attention specialist for treatment of ADHD.  Meds ordered this encounter  Medications  . FLUoxetine (PROZAC) 20 MG tablet    Sig: Take 1 tablet (20 mg total) by mouth daily.    Dispense:  90 tablet    Refill:  1  . FLUoxetine (PROZAC) 40 MG capsule    Sig: Take 1 capsule (40 mg total) by mouth daily.    Dispense:  90 capsule    Refill:  1   Patient Instructions    I'm glad to hear that depression and anxiety symptoms are improving. You can try increasing  prozac to 60mg  per day (40mg  and 20mg  each day) and then follow up in the next 3 months to see how that dose is doing.   I will check some fasting labwork today, but if those levels are elevated, may need to be repeated for 8 hours.    Keeping you healthy  Get these tests  Johnson pressure- Have your Johnson pressure checked once a year by your healthcare provider.  Normal Johnson pressure is 120/80.  Weight- Have your body mass index (BMI) calculated to screen for obesity.  BMI is a measure of body fat based on height and weight. You can also calculate your own BMI at [employer].it.  Cholesterol- Have your cholesterol checked regularly starting at age 96, sooner may be necessary if you have diabetes, high Johnson pressure, if a family member developed heart diseases at an early age or if you smoke.   Chlamydia, HIV, and other sexual transmitted disease- Get screened each year until the age of 54 then within three months of each new sexual partner.  Diabetes- Have your Johnson sugar checked regularly if you have high Johnson pressure, high cholesterol, a family history of diabetes or if you are overweight.  Get these vaccines  Flu shot- Every fall.  Tetanus shot- Every 10 years.  Menactra- Single dose; prevents meningitis.  Take these steps  Don't smoke- If you do smoke, ask your healthcare provider about quitting. For tips on how to quit, go to www.smokefree.gov or call 1-800-QUIT-NOW.  Be physically active- Exercise 5 days a week for at least 30 minutes.  If you are not already physically active start slow and gradually work up to 30 minutes of moderate physical activity.  Examples of moderate activity include walking briskly, mowing the yard, dancing, swimming bicycling, etc.  Eat a healthy diet- Eat a variety of healthy foods such as fruits, vegetables, low fat milk, low fat cheese, yogurt, lean meats, poultry, fish, beans, tofu, etc.  For more information on healthy eating, go  to www.thenutritionsource.org  Drink alcohol in moderation- Limit alcohol intake two drinks or less a day.  Never drink and drive.  Dentist- Brush and floss teeth twice daily; visit your dentis twice a year.  Depression-Your emotional health is as important as your physical health.  If you're feeling down, losing interest in things you normally enjoy please talk with your healthcare provider.  Gun Safety- If you keep a gun in your home, keep it unloaded and with the safety lock on.  Bullets should be stored separately.  Helmet use- Always wear a helmet when riding a motorcycle, bicycle, rollerblading or skateboarding.  Safe sex- If you may be exposed to a sexually transmitted infection, use a condom  Seat belts- Seat bels can save your life; always wear one.  Smoke/Carbon Monoxide detectors- These detectors need to be installed on the appropriate level of your home.  Replace batteries at least once a year.  Skin Cancer- When out in the sun, cover up and use sunscreen SPF 15 or higher.  Violence- If anyone is threatening or hurting you, please tell your healthcare provider.    IF you received an x-Johnson today, you will receive an invoice from [facility]. Please contact [facility] at [phone] with questions or concerns regarding your invoice.   IF you received labwork today, you will receive an invoice from [place]. Please contact [facility] at [phone] with questions or concerns regarding your invoice.   Our billing staff will not be able to assist you with questions regarding bills from these companies.  You will be contacted with the lab results as soon as they are available. The fastest way to get your results is to activate your My Chart account. Instructions are located on the last page of this paperwork. If you have not heard from [place] regarding the results in 2 weeks, please contact this office.       I personally performed the services described in  this documentation, which was scribed in my presence. The recorded information has been reviewed and considered for accuracy and completeness, addended by me as needed, and agree with information above.  Signed,   Alejandro Ray, MD [facility] [place].  02/17/18 2:36 PM

## 2018-02-18 LAB — CBC
Hematocrit: 43.7 % (ref 37.5–51.0)
Hemoglobin: 15.2 g/dL (ref 13.0–17.7)
MCH: 29.5 pg (ref 26.6–33.0)
MCHC: 34.8 g/dL (ref 31.5–35.7)
MCV: 85 fL (ref 79–97)
PLATELETS: 333 10*3/uL (ref 150–450)
RBC: 5.16 x10E6/uL (ref 4.14–5.80)
RDW: 14.2 % (ref 12.3–15.4)
WBC: 6.7 10*3/uL (ref 3.4–10.8)

## 2018-02-18 LAB — LIPID PANEL
CHOLESTEROL TOTAL: 213 mg/dL — AB (ref 100–199)
Chol/HDL Ratio: 4.2 ratio (ref 0.0–5.0)
HDL: 51 mg/dL (ref >39)
LDL Calculated: 115 mg/dL — ABNORMAL HIGH (ref 0–99)
Triglycerides: 236 mg/dL — ABNORMAL HIGH (ref 0–149)
VLDL Cholesterol Cal: 47 mg/dL — ABNORMAL HIGH (ref 5–40)

## 2018-02-18 LAB — COMPREHENSIVE METABOLIC PANEL
ALBUMIN: 4.9 g/dL (ref 3.5–5.5)
ALT: 28 IU/L (ref 0–44)
AST: 22 IU/L (ref 0–40)
Albumin/Globulin Ratio: 1.9 (ref 1.2–2.2)
Alkaline Phosphatase: 59 IU/L (ref 39–117)
BUN / CREAT RATIO: 10 (ref 9–20)
BUN: 11 mg/dL (ref 6–20)
Bilirubin Total: 0.3 mg/dL (ref 0.0–1.2)
CALCIUM: 9.7 mg/dL (ref 8.7–10.2)
CO2: 28 mmol/L (ref 20–29)
CREATININE: 1.12 mg/dL (ref 0.76–1.27)
Chloride: 98 mmol/L (ref 96–106)
GFR calc Af Amer: 96 mL/min/{1.73_m2} (ref >59)
GFR calc non Af Amer: 83 mL/min/{1.73_m2} (ref >59)
GLOBULIN, TOTAL: 2.6 g/dL (ref 1.5–4.5)
Glucose: 79 mg/dL (ref 65–99)
Potassium: 4 mmol/L (ref 3.5–5.2)
SODIUM: 140 mmol/L (ref 134–144)
Total Protein: 7.5 g/dL (ref 6.0–8.5)

## 2018-02-18 LAB — HIV ANTIBODY (ROUTINE TESTING W REFLEX): HIV SCREEN 4TH GENERATION: NONREACTIVE

## 2018-02-20 ENCOUNTER — Ambulatory Visit: Payer: No Typology Code available for payment source | Admitting: Family Medicine

## 2018-02-24 ENCOUNTER — Encounter: Payer: Self-pay | Admitting: Family Medicine

## 2018-03-14 MED FILL — MYDAYIS ER 50 MG CAPSULE: 50 | 30 days supply | Qty: 30 | Fill #0

## 2018-04-11 MED FILL — MYDAYIS ER 50 MG CAPSULE: 50 | 30 days supply | Qty: 30 | Fill #0

## 2018-05-15 ENCOUNTER — Encounter: Payer: Self-pay | Admitting: Family Medicine

## 2018-05-16 ENCOUNTER — Telehealth: Payer: Self-pay | Admitting: Family Medicine

## 2018-05-16 ENCOUNTER — Ambulatory Visit: Payer: No Typology Code available for payment source | Admitting: Family Medicine

## 2018-05-16 DIAGNOSIS — F418 Other specified anxiety disorders: Secondary | ICD-10-CM

## 2018-05-16 MED FILL — MYDAYIS ER 50 MG CAPSULE: 50 | 30 days supply | Qty: 30 | Fill #0

## 2018-05-16 NOTE — Telephone Encounter (Signed)
Copied from [place] [id number]. Topic: General - Other >> May 16, 2018 11:14 AM Carolyn Stare wrote: Alejandro Johnson with [facility] pharmacy call to ask if they could change pt RX to 3 20 mg capsule per day which will reduce his copay by 45.00

## 2018-05-17 MED ORDER — FLUOXETINE HCL 20 MG PO TABS: 60.0000 mg | ORAL_TABLET | Freq: Every day | ORAL | 0 refills | Status: DC

## 2018-05-17 NOTE — Telephone Encounter (Signed)
Absolutely.  I will change that dosing to indicate 60 mg with 3 of the 20 mg prozac per day.  I did send a new prescription to his pharmacy.  Please follow-up in the next month if possible.

## 2018-05-17 NOTE — Telephone Encounter (Signed)
Pharmacy would like to know if tablets can be changed to capsules to make copay a lot cheaper

## 2018-05-17 NOTE — Telephone Encounter (Signed)
Please advise 

## 2018-05-19 MED ORDER — FLUOXETINE HCL 20 MG PO CAPS: 60.0000 mg | ORAL_CAPSULE | Freq: Every day | ORAL | 0 refills | Status: DC

## 2018-05-19 MED FILL — FLUoxetine HCL 20 MG TABS: 20 | 90 days supply | Qty: 270 | Fill #0

## 2018-05-19 NOTE — Addendum Note (Signed)
Addended by: [name] R on: 05/19/2018 04:39 PM   Modules accepted: Orders

## 2018-05-19 NOTE — Telephone Encounter (Signed)
Done. Thanks.

## 2018-05-19 NOTE — Telephone Encounter (Signed)
Dr Carlota Raspberry are tabs ok ? If so I will notify pharmacy ok to change as tabs are cheaper.\ Dgaddy, CMA

## 2018-06-13 MED FILL — MYDAYIS ER 50 MG CAPSULE: 50 | 30 days supply | Qty: 30 | Fill #0

## 2018-07-11 MED FILL — MYDAYIS ER 50 MG CAPSULE: 50 | 30 days supply | Qty: 30 | Fill #0

## 2018-07-25 ENCOUNTER — Encounter: Payer: Self-pay | Admitting: Family Medicine

## 2018-07-25 ENCOUNTER — Ambulatory Visit (INDEPENDENT_AMBULATORY_CARE_PROVIDER_SITE_OTHER): Payer: No Typology Code available for payment source | Admitting: Family Medicine

## 2018-07-25 ENCOUNTER — Other Ambulatory Visit: Payer: Self-pay

## 2018-07-25 VITALS — BP 140/90 | HR 82 | Temp 98.4°F | Ht 74.0 in | Wt 202.0 lb

## 2018-07-25 DIAGNOSIS — F988 Other specified behavioral and emotional disorders with onset usually occurring in childhood and adolescence: Secondary | ICD-10-CM

## 2018-07-25 DIAGNOSIS — F329 Major depressive disorder, single episode, unspecified: Secondary | ICD-10-CM

## 2018-07-25 DIAGNOSIS — Z23 Encounter for immunization: Secondary | ICD-10-CM | POA: Diagnosis not present

## 2018-07-25 DIAGNOSIS — D179 Benign lipomatous neoplasm, unspecified: Secondary | ICD-10-CM

## 2018-07-25 DIAGNOSIS — F32A Depression, unspecified: Secondary | ICD-10-CM

## 2018-07-25 MED ORDER — FLUOXETINE HCL 20 MG PO CAPS: 60.0000 mg | ORAL_CAPSULE | Freq: Every day | ORAL | 1 refills | Status: DC

## 2018-07-25 MED ORDER — FLUOXETINE HCL 20 MG PO CAPS: 60.0000 mg | ORAL_CAPSULE | Freq: Every day | ORAL | 0 refills | Status: DC

## 2018-07-25 MED FILL — FLUoxetine HCL 20 MG CAPS: 20 | 90 days supply | Qty: 270 | Fill #0

## 2018-07-25 NOTE — Patient Instructions (Addendum)
  I can potentially refill the Mydayis, but will need to review the most recent records from [facility].   Continue prozac 60mg  per day.     If you have lab work done today you will be contacted with your lab results within the next 2 weeks.  If you have not heard from [place] then please contact us. The fastest way to get your results is to register for My Chart.   IF you received an x-ray today, you will receive an invoice from [facility]. Please contact [facility] at [phone] with questions or concerns regarding your invoice.   IF you received labwork today, you will receive an invoice from [place]. Please contact [facility] at [phone] with questions or concerns regarding your invoice.   Our billing staff will not be able to assist you with questions regarding bills from these companies.  You will be contacted with the lab results as soon as they are available. The fastest way to get your results is to activate your My Chart account. Instructions are located on the last page of this paperwork. If you have not heard from [place] regarding the results in 2 weeks, please contact this office.

## 2018-07-25 NOTE — Progress Notes (Signed)
Subjective:    Patient ID: Alejandro Johnson, male    DOB: 1981-02-15, 37 y.o.   MRN: [id number]  HPI Alejandro R Ruan is a 37 y.o. male Presents today for: Chief Complaint  Patient presents with  . ADHD    f/u    Last seen in June for a physical.  Has been treated with depression anxiety with Prozac up to 40 mg daily since March.  He did feel a bit he was still having some dips of depression and wanted to try higher dose of Prozac.  It was increased to 60 mg/day.  He was being seen by therapist, but had stopped sessions as he felt like he was in a good place.  Had also been seen by [place] [facility], diagnosed with ADHD and was taking Vyvanse for about 6 weeks at that time.   Feels like prozac is doing well at current dose - no new side effects.   Has 74 and 68/21 month old at home. Cecilia. Spends most days with her - all day. 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   Last appt cancelled with [place] [facility] due to some financial issues. On Mydayis.ER 50mg  qd. Current dose doing well.  Same dose for past few months. vyvnase effect would wear off.  Last Rx from [facility] 07/11/18.    Feels like meds and mood, home - overall is doing well.  Bumps on legs, arms, back stomach for years. Others seem to come in new areas. No symptoms, just concerned with them being there. No fevers, rash, wt loss, night sweats.    Depression screen [facility] 2/9 07/25/2018 02/17/2018 11/21/2017 10/14/2017  Decreased Interest 0 0 3 3  Down, Depressed, Hopeless 0 0 3 3  PHQ - 2 Score 0 0 6 6  Altered sleeping - - 2 3  Tired, decreased energy - - 2 3  Change in appetite - - 0 1  Feeling bad or failure about yourself  - - 2 3  Trouble concentrating - - 3 3  Moving slowly or fidgety/restless - - 3 1  Suicidal thoughts - - 0 2  PHQ-9 Score - - 18 22  Difficult doing work/chores - - - Very difficult     Patient Active Problem List   Diagnosis Date Noted  . CHRONIC PROSTATITIS 07/08/2008  .  DYSURIA, HX OF 07/08/2008   Past Medical History:  Diagnosis Date  . Allergy   . Anxiety   . Depression    No past surgical history on file. Allergies  Allergen Reactions  . Codeine Itching and Rash   Prior to Admission medications   Medication Sig Start Date End Date Taking? Authorizing Provider  FLUoxetine (PROZAC) 20 MG capsule Take 3 capsules (60 mg total) by mouth daily. 05/19/18  Yes Wendie Agreste, MD  ibuprofen (ADVIL,MOTRIN) 200 MG tablet Take 400 mg every 6 (six) hours as needed by mouth for headache.   Yes [provider]  loratadine (CLARITIN) 10 MG tablet Take 10 mg daily by mouth.   Yes [provider]  MYDAYIS 50 MG CP24  07/11/18  Yes [provider]   Social History   Socioeconomic History  . Marital status: Married    Spouse name: [name]  . Number of children: 2  . Years of education: Not on file  . Highest education level: Not on file  Occupational History  . Not on file  Social Needs  . Financial resource strain: Not  on file  . Food insecurity:    Worry: Not on file    Inability: Not on file  . Transportation needs:    Medical: Not on file    Non-medical: Not on file  Tobacco Use  . Smoking status: Former Smoker    Types: Cigarettes  . Smokeless tobacco: Never Used  Substance and Sexual Activity  . Alcohol use: Yes    Comment: once weekly   . Drug use: No  . Sexual activity: Yes  Lifestyle  . Physical activity:    Days per week: Not on file    Minutes per session: Not on file  . Stress: Not on file  Relationships  . Social connections:    Talks on phone: Not on file    Gets together: Not on file    Attends religious service: Not on file    Active member of club or organization: Not on file    Attends meetings of clubs or organizations: Not on file    Relationship status: Not on file  . Intimate partner violence:    Fear of current or ex partner: Not on file    Emotionally abused: Not on file    Physically  abused: Not on file    Forced sexual activity: Not on file  Other Topics Concern  . Not on file  Social History Narrative  . Not on file   Review of Systems  Constitutional: Positive for appetite change (minimal decrease with meds, no wt loss.).  Respiratory: Negative for chest tightness.   Cardiovascular: Negative for chest pain and palpitations.  Psychiatric/Behavioral: Negative for dysphoric mood and sleep disturbance. The patient is not nervous/anxious.       Objective:   Physical Exam  Constitutional: He is oriented to person, place, and time. He appears well-developed and well-nourished.  HENT:  Head: Normocephalic and atraumatic.  Eyes: Pupils are equal, round, and reactive to light. EOM are normal.  Cardiovascular: Normal rate, regular rhythm, normal heart sounds and intact distal pulses.  No extrasystoles are present.  No murmur heard. Pulmonary/Chest: Effort normal and breath sounds normal.  Neurological: He is alert and oriented to person, place, and time.  Skin: Skin is warm and dry.  Psychiatric: He has a normal mood and affect. His behavior is normal. Judgment and thought content normal.  Vitals reviewed.  Vitals:   07/25/18 1409 07/25/18 1411  BP: (!) 176/94 140/90  Pulse: 82   Temp: 98.4 F (36.9 C)   TempSrc: Oral   SpO2: 94%   Weight: 202 lb (91.6 kg)   Height: 6\' 2"  (1.88 m)        Assessment & Plan:   Alejandro R Esselman is a 37 y.o. male Depression, unspecified depression type - Plan: FLUoxetine (PROZAC) 20 MG capsule, DISCONTINUED: FLUoxetine (PROZAC) 20 MG capsule  - doing well.  Continue same dose of Prozac for now.  Need for influenza vaccination - Plan: Flu Vaccine QUAD 36+ mos IM  Lipoma, unspecified site - Plan: Ambulatory referral to Dermatology  -Multiple suspect lipomas, but will refer to dermatology to verify and second opinion.  Attention deficit disorder, unspecified hyperactivity presence  -Stable.  Agreed to refill medication at same  dose when due, will try to obtain records from attention specialist from most recent visit.  Meds ordered this encounter  Medications  . DISCONTD: FLUoxetine (PROZAC) 20 MG capsule    Sig: Take 3 capsules (60 mg total) by mouth daily.    Dispense:  270 capsule  Refill:  0  . FLUoxetine (PROZAC) 20 MG capsule    Sig: Take 3 capsules (60 mg total) by mouth daily.    Dispense:  270 capsule    Refill:  1   Patient Instructions    I can potentially refill the Mydayis, but will need to review the most recent records from attention specialists.   Continue prozac 60mg  per day.     If you have lab work done today you will be contacted with your lab results within the next 2 weeks.  If you have not heard from [place] then please contact us. The fastest way to get your results is to register for My Chart.   IF you received an x-ray today, you will receive an invoice from [facility]. Please contact [facility] at [phone] with questions or concerns regarding your invoice.   IF you received labwork today, you will receive an invoice from [place]. Please contact [facility] at [phone] with questions or concerns regarding your invoice.   Our billing staff will not be able to assist you with questions regarding bills from these companies.  You will be contacted with the lab results as soon as they are available. The fastest way to get your results is to activate your My Chart account. Instructions are located on the last page of this paperwork. If you have not heard from [place] regarding the results in 2 weeks, please contact this office.       Signed,   [name], MD [facility] [place].  07/31/18 11:21 AM

## 2018-07-31 ENCOUNTER — Encounter: Payer: Self-pay | Admitting: Family Medicine

## 2018-08-07 ENCOUNTER — Encounter: Payer: Self-pay | Admitting: Family Medicine

## 2018-08-09 ENCOUNTER — Encounter: Payer: Self-pay | Admitting: Family Medicine

## 2018-08-13 MED ORDER — MYDAYIS 50 MG PO CP24: 50.0000 mg | ORAL_CAPSULE | Freq: Every day | ORAL | 0 refills | Status: DC

## 2018-08-14 MED FILL — MYDAYIS ER 50 MG CAPSULE: 50 | 30 days supply | Qty: 30 | Fill #0

## 2018-09-07 IMAGING — DX DG CHEST 2V
2 series · 2 of 2 positions shown · non-contrast
Comparison: Radiographs August 08, 2005.

CLINICAL DATA: Lower back pain.

EXAM:
CHEST  2 VIEW

[chest pa]
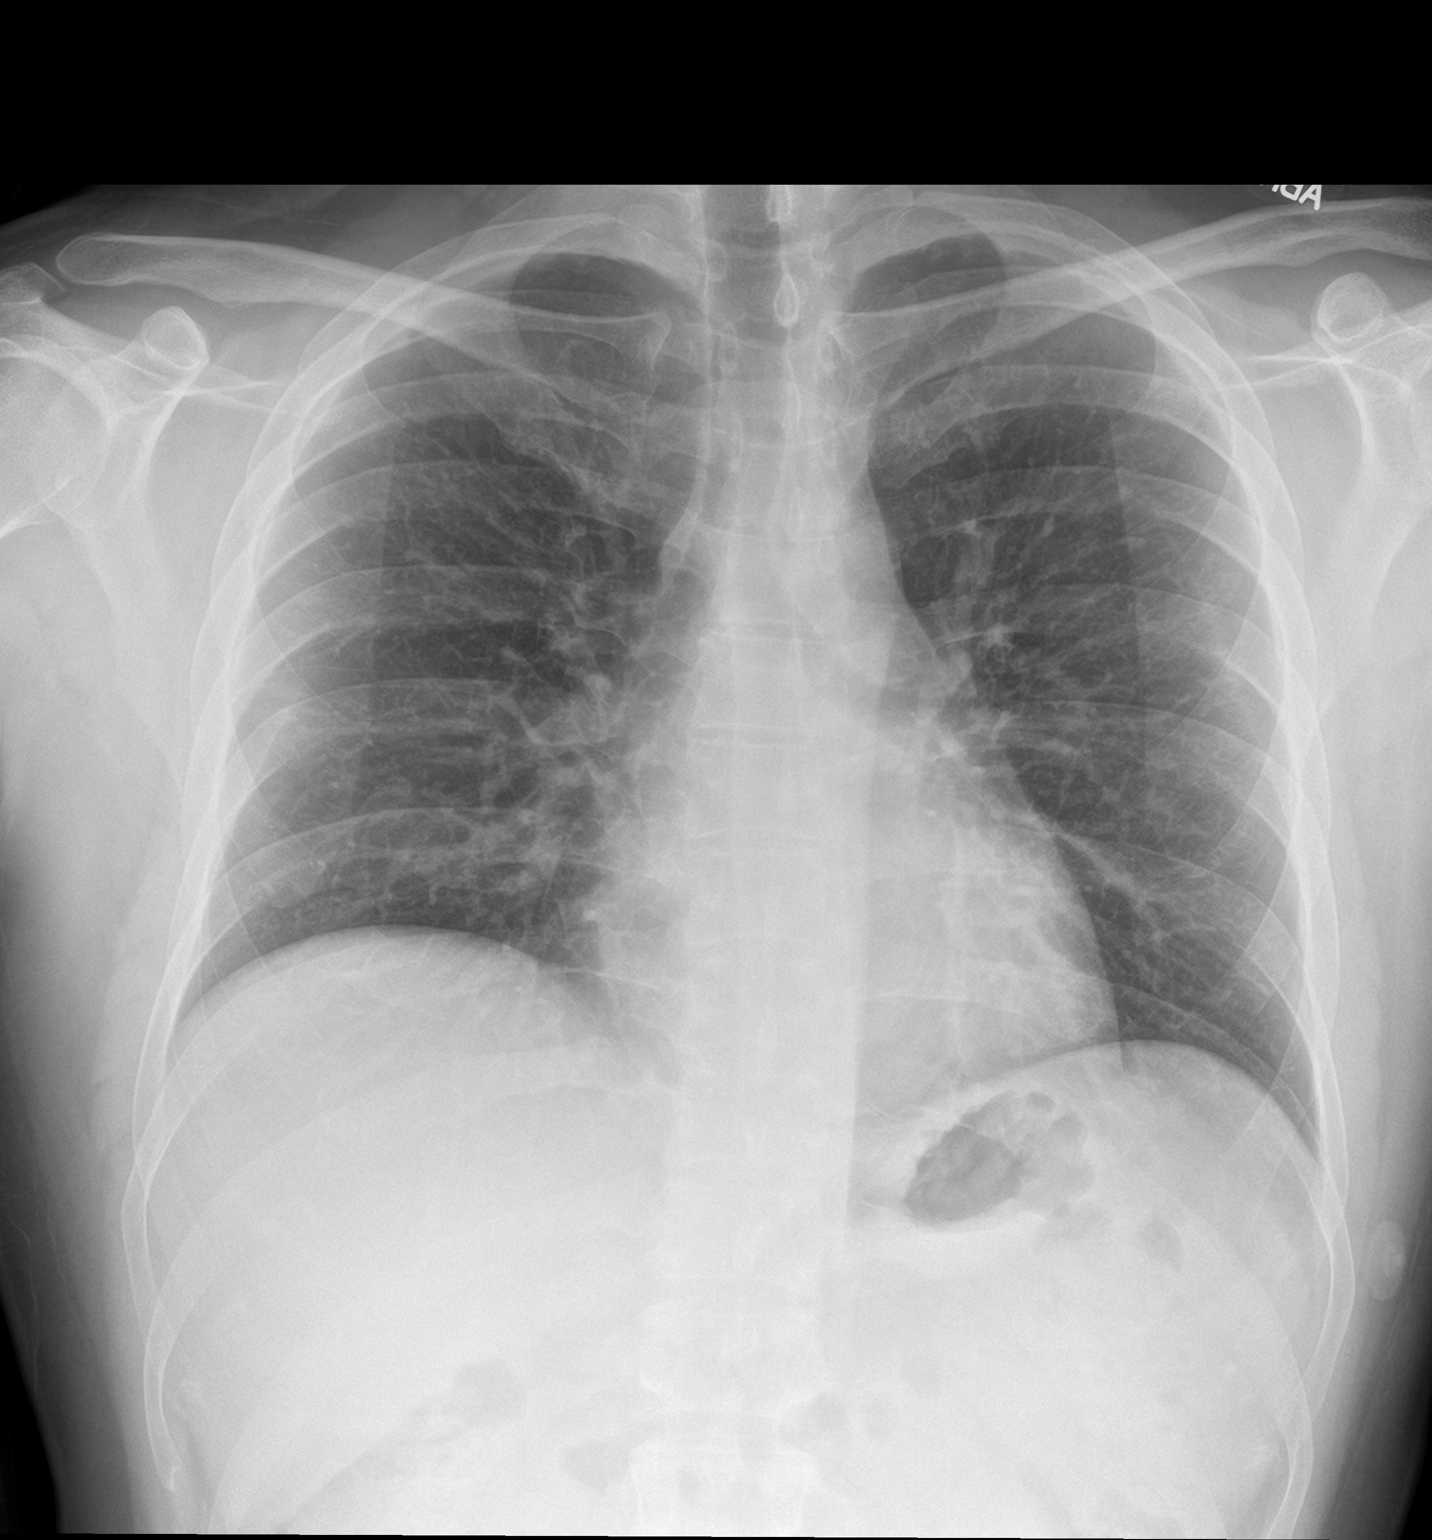

[chest lat]
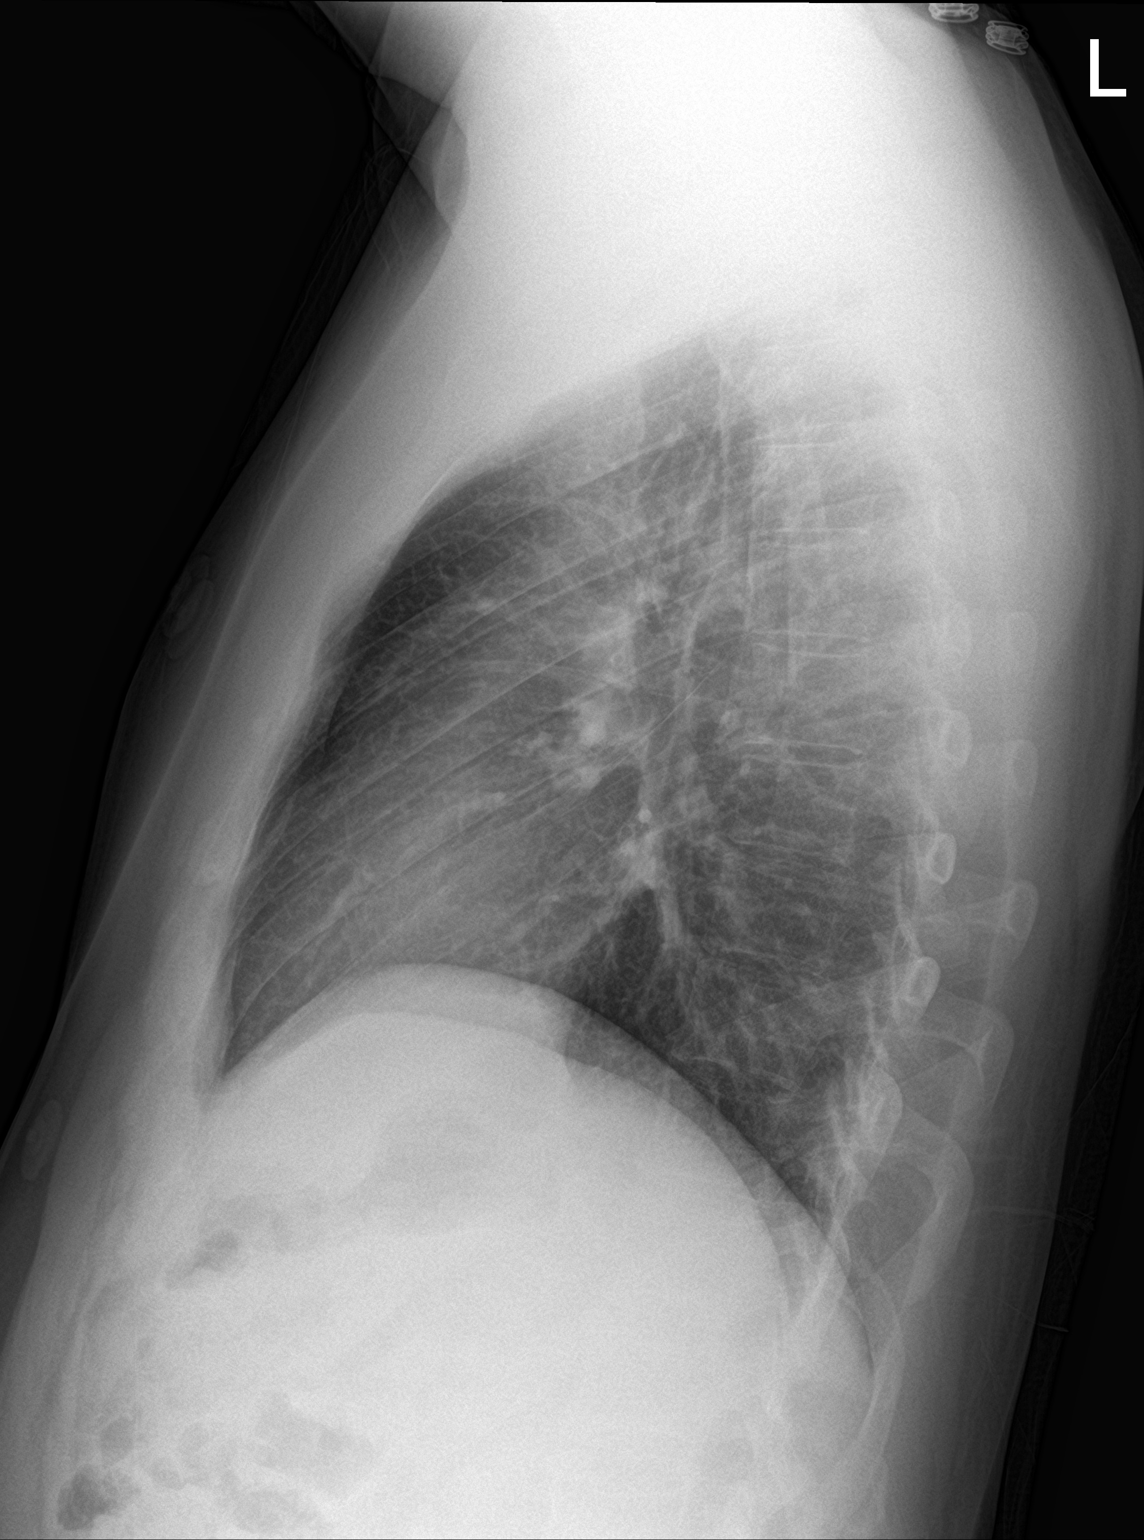

[2 of 2 positions shown; findings below may reference images not displayed]

FINDINGS: The heart size and mediastinal contours are within normal limits.
Both lungs are clear. No pneumothorax or pleural effusion is noted.
The visualized skeletal structures are unremarkable.
IMPRESSION: No active cardiopulmonary disease.

## 2018-09-07 IMAGING — DX DG LUMBAR SPINE COMPLETE 4+V
5 series · 5 of 5 positions shown · non-contrast
Comparison: Radiographs February 04, 2013.

CLINICAL DATA: Low back pain.

EXAM:
LUMBAR SPINE - COMPLETE 4+ VIEW

[l-spine ap]
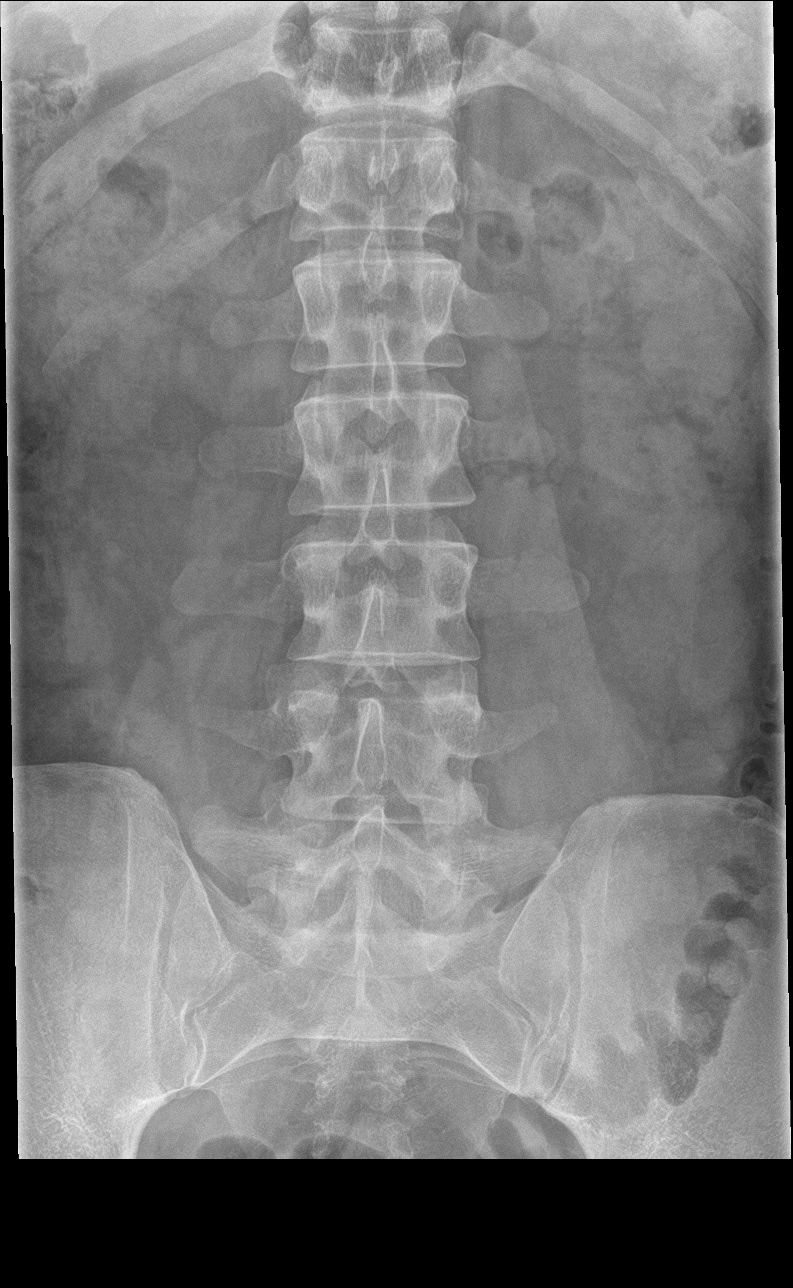

[l-spine obl (1 of 2)]
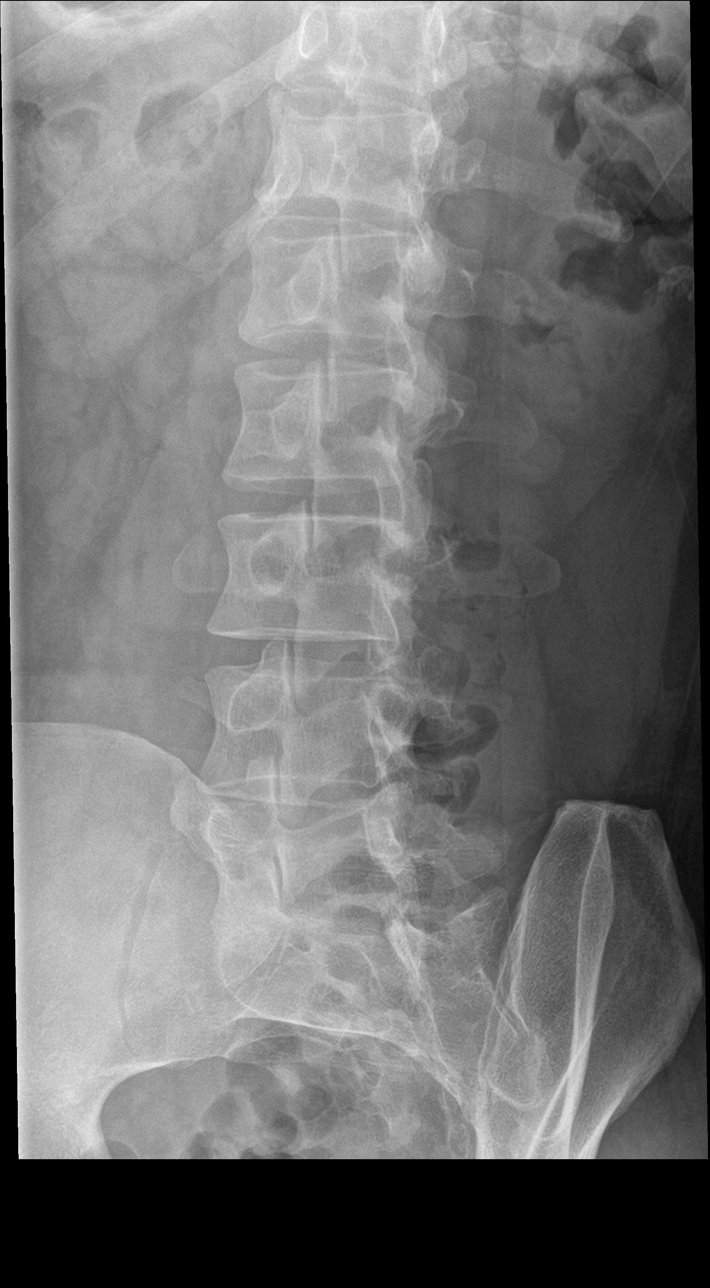

[l-spine obl (2 of 2)]
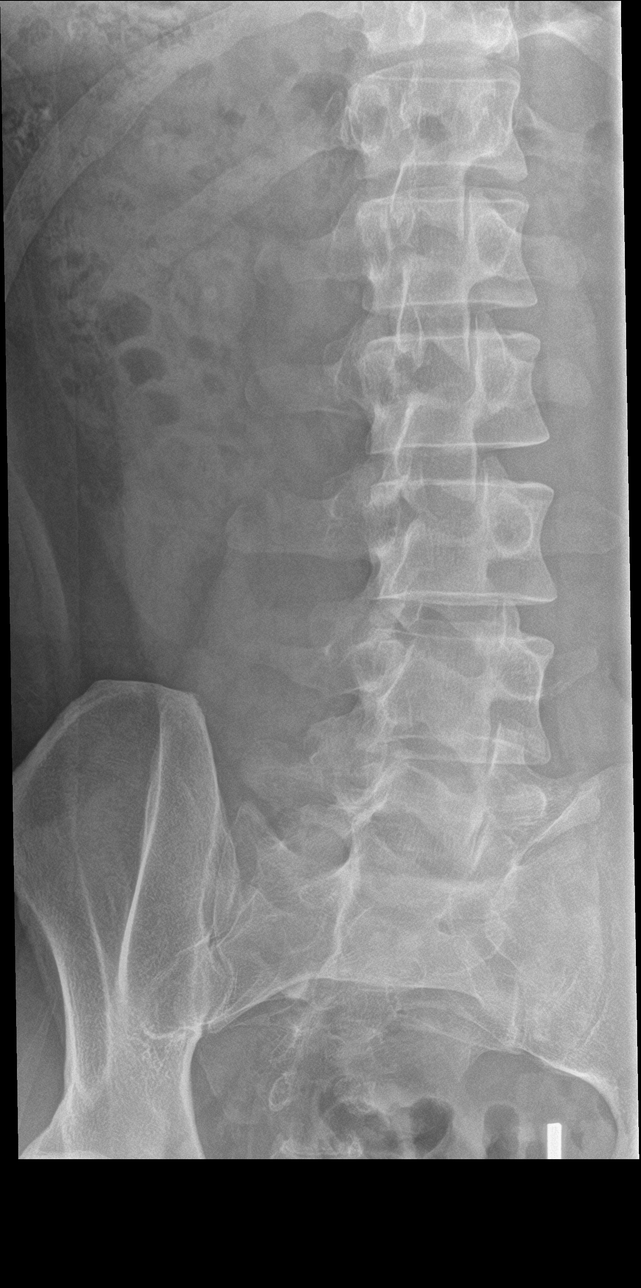

[l-spine lat]
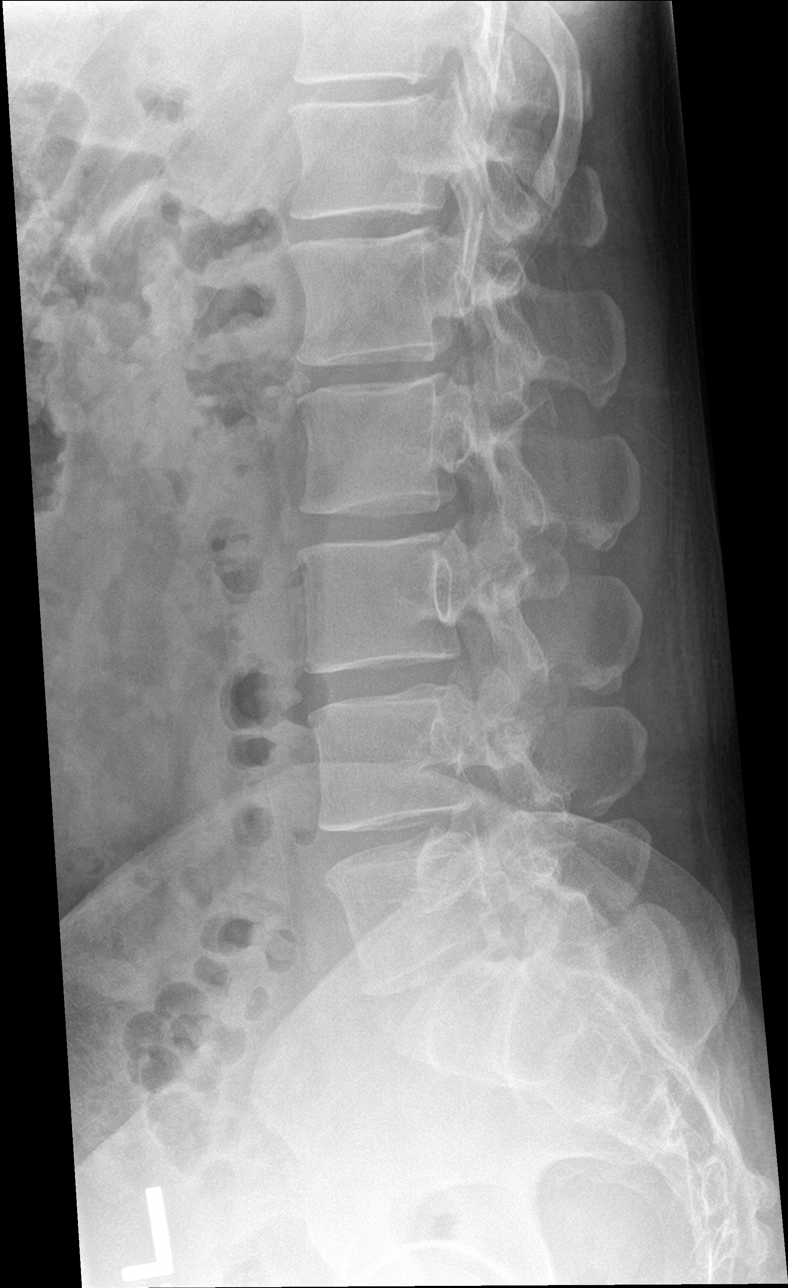

[l-spine spot]
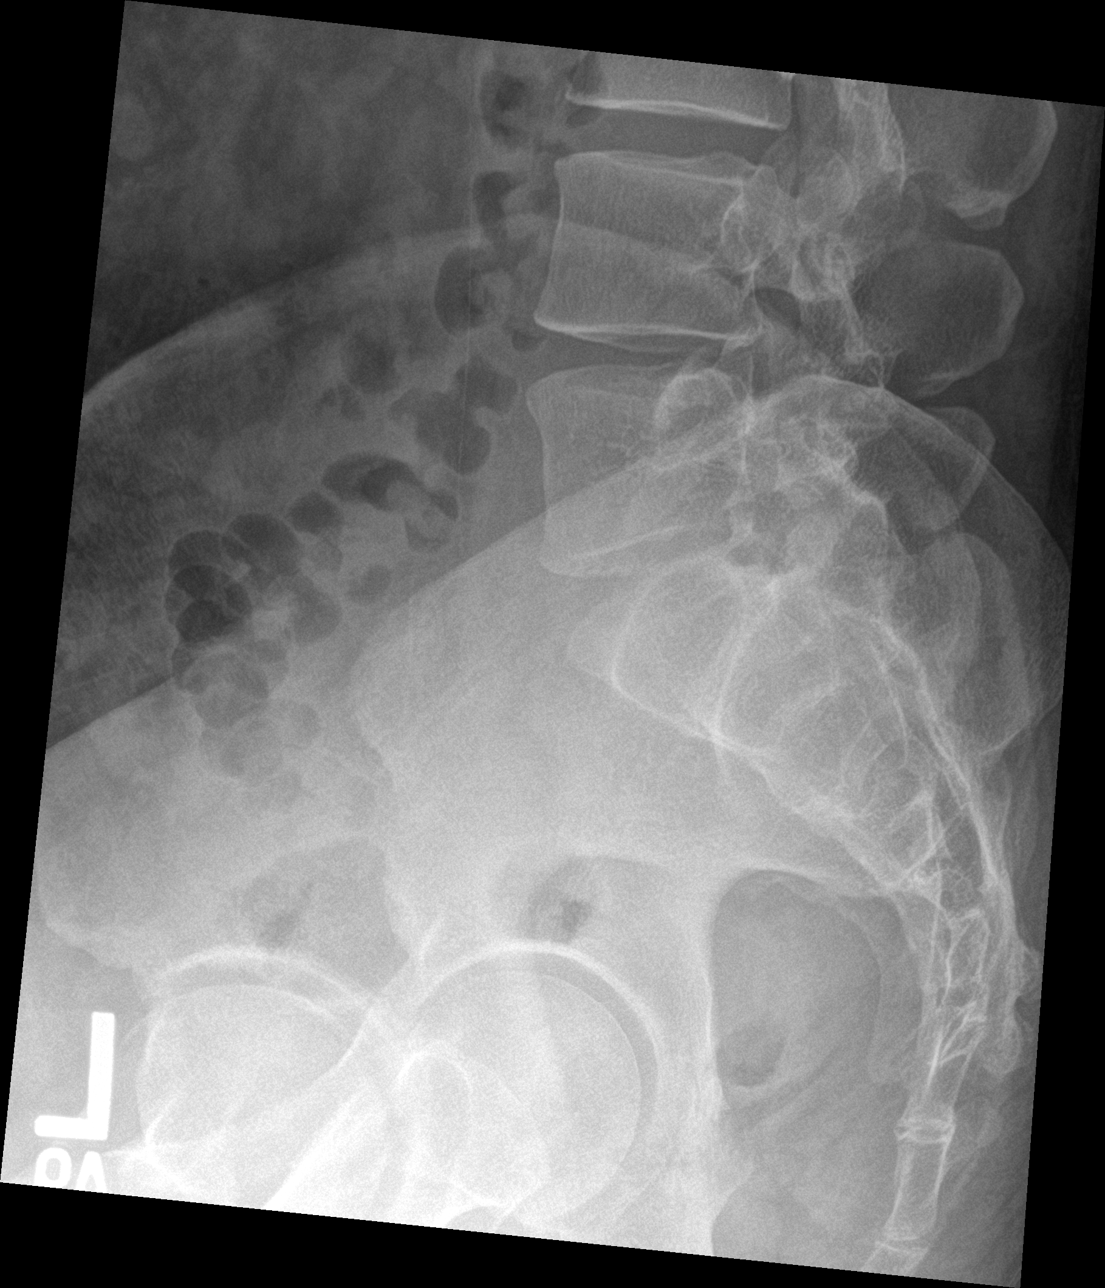

[5 of 5 positions shown; findings below may reference images not displayed]

FINDINGS: There is no evidence of lumbar spine fracture. Alignment is normal.
Intervertebral disc spaces are maintained.
IMPRESSION: Normal lumbar spine.

## 2018-09-11 ENCOUNTER — Other Ambulatory Visit: Payer: Self-pay | Admitting: Family Medicine

## 2018-09-11 ENCOUNTER — Encounter: Payer: Self-pay | Admitting: Family Medicine

## 2018-09-11 MED ORDER — MYDAYIS 50 MG PO CP24: 50.0000 mg | ORAL_CAPSULE | Freq: Every day | ORAL | 0 refills | Status: DC

## 2018-09-11 MED FILL — MYDAYIS ER 50 MG CAPSULE: 50 | 30 days supply | Qty: 30 | Fill #0

## 2018-09-11 NOTE — Telephone Encounter (Signed)
Records were received. Refilled.

## 2018-09-11 NOTE — Telephone Encounter (Signed)
Please advise 

## 2018-10-12 ENCOUNTER — Other Ambulatory Visit: Payer: Self-pay | Admitting: Family Medicine

## 2018-10-12 MED ORDER — MYDAYIS 50 MG PO CP24: 50.0000 mg | ORAL_CAPSULE | Freq: Every day | ORAL | 0 refills | Status: DC

## 2018-10-12 MED FILL — MYDAYIS ER 50 MG CAPSULE: 50 | 30 days supply | Qty: 30 | Fill #0

## 2018-10-12 NOTE — Telephone Encounter (Signed)
Please advise on refill request

## 2018-10-12 NOTE — Telephone Encounter (Signed)
Controlled substance database (PDMP) reviewed. No concerns appreciated.  Last rx 12/30. Will refill.

## 2018-10-26 ENCOUNTER — Other Ambulatory Visit: Payer: Self-pay

## 2018-10-26 ENCOUNTER — Encounter: Payer: Self-pay | Admitting: Family Medicine

## 2018-10-26 ENCOUNTER — Ambulatory Visit (INDEPENDENT_AMBULATORY_CARE_PROVIDER_SITE_OTHER): Payer: No Typology Code available for payment source | Admitting: Family Medicine

## 2018-10-26 VITALS — BP 120/90 | HR 102 | Temp 99.0°F | Resp 16 | Ht 74.0 in | Wt 205.6 lb

## 2018-10-26 DIAGNOSIS — M545 Low back pain, unspecified: Secondary | ICD-10-CM

## 2018-10-26 DIAGNOSIS — F32A Depression, unspecified: Secondary | ICD-10-CM

## 2018-10-26 DIAGNOSIS — F329 Major depressive disorder, single episode, unspecified: Secondary | ICD-10-CM | POA: Diagnosis not present

## 2018-10-26 DIAGNOSIS — F988 Other specified behavioral and emotional disorders with onset usually occurring in childhood and adolescence: Secondary | ICD-10-CM

## 2018-10-26 MED ORDER — FLUOXETINE HCL 20 MG PO CAPS: 60.0000 mg | ORAL_CAPSULE | Freq: Every day | ORAL | 1 refills | Status: DC

## 2018-10-26 NOTE — Patient Instructions (Addendum)
Back pain may be muscular and from carrying child. Try changing position with carrying to see if that helps, occasional tylenol if needed. Heating pad at night ok for now.  If back pain is not improving the next few weeks or any worsening symptoms come back and see me sooner we can do x-rays or further work-up.   Try walking or other physical activity most day per week.   I am happy to refer you to sleep specialist at some point to evaluate the movements with sleep. Let me know if you would like to have that done, or can meet with your therapist to discuss the symptoms as well.  No medication changes for now, call for next refill for Mydayis, plan for follow-up office visit in 6 months.   Acute Back Pain, Adult Acute back pain is sudden and usually short-lived. It is often caused by an injury to the muscles and tissues in the back. The injury may result from:  A muscle or ligament getting overstretched or torn (strained). Ligaments are tissues that connect bones to each other. Lifting something improperly can cause a back strain.  Wear and tear (degeneration) of the spinal disks. Spinal disks are circular tissue that provides cushioning between the bones of the spine (vertebrae).  Twisting motions, such as while playing sports or doing yard work.  A hit to the back.  Arthritis. You may have a physical exam, lab tests, and imaging tests to find the cause of your pain. Acute back pain usually goes away with rest and home care. Follow these instructions at home: Managing pain, stiffness, and swelling  Take over-the-counter and prescription medicines only as told by your health care provider.  Your health care provider may recommend applying ice during the first 24-48 hours after your pain starts. To do this: ? Put ice in a plastic bag. ? Place a towel between your skin and the bag. ? Leave the ice on for 20 minutes, 2-3 times a day.  If directed, apply heat to the affected area as  often as told by your health care provider. Use the heat source that your health care provider recommends, such as a moist heat pack or a heating pad. ? Place a towel between your skin and the heat source. ? Leave the heat on for 20-30 minutes. ? Remove the heat if your skin turns bright red. This is especially important if you are unable to feel pain, heat, or cold. You have a greater risk of getting burned. Activity   Do not stay in bed. Staying in bed for more than 1-2 days can delay your recovery.  Sit up and stand up straight. Avoid leaning forward when you sit, or hunching over when you stand. ? If you work at a desk, sit close to it so you do not need to lean over. Keep your chin tucked in. Keep your neck drawn back, and keep your elbows bent at a right angle. Your arms should look like the letter "L." ? Sit high and close to the steering wheel when you drive. Add lower back (lumbar) support to your car seat, if needed.  Take short walks on even surfaces as soon as you are able. Try to increase the length of time you walk each day.  Do not sit, drive, or stand in one place for more than 30 minutes at a time. Sitting or standing for long periods of time can put stress on your back.  Do not drive or  use heavy machinery while taking prescription pain medicine.  Use proper lifting techniques. When you bend and lift, use positions that put less stress on your back: ? [place] your knees. ? Keep the load close to your body. ? Avoid twisting.  Exercise regularly as told by your health care provider. Exercising helps your back heal faster and helps prevent back injuries by keeping muscles strong and flexible.  Work with a physical therapist to make a safe exercise program, as recommended by your health care provider. Do any exercises as told by your physical therapist. Lifestyle  Maintain a healthy weight. Extra weight puts stress on your back and makes it difficult to have good  posture.  Avoid activities or situations that make you feel anxious or stressed. Stress and anxiety increase muscle tension and can make back pain worse. Learn ways to manage anxiety and stress, such as through exercise. General instructions  Sleep on a firm mattress in a comfortable position. Try lying on your side with your knees slightly bent. If you lie on your back, put a pillow under your knees.  Follow your treatment plan as told by your health care provider. This may include: ? Cognitive or behavioral therapy. ? Acupuncture or massage therapy. ? Meditation or yoga. Contact a health care provider if:  You have pain that is not relieved with rest or medicine.  You have increasing pain going down into your legs or buttocks.  Your pain does not improve after 2 weeks.  You have pain at night.  You lose weight without trying.  You have a fever or chills. Get help right away if:  You develop new bowel or bladder control problems.  You have unusual weakness or numbness in your arms or legs.  You develop nausea or vomiting.  You develop abdominal pain.  You feel faint. Summary  Acute back pain is sudden and usually short-lived.  Use proper lifting techniques. When you bend and lift, use positions that put less stress on your back.  Take over-the-counter and prescription medicines and apply heat or ice as directed by your health care provider. This information is not intended to replace advice given to you by your health care provider. Make sure you discuss any questions you have with your health care provider. Document Released: 08/30/2005 Document Revised: 04/06/2018 Document Reviewed: 04/13/2017 Elsevier Interactive Patient Education  [employer].    If you have lab work done today you will be contacted with your lab results within the next 2 weeks.  If you have not heard from [place] then please contact us. The fastest way to get your results is to register for My  Chart.   IF you received an x-ray today, you will receive an invoice from [facility]. Please contact [facility] at [phone] with questions or concerns regarding your invoice.   IF you received labwork today, you will receive an invoice from [place]. Please contact [facility] at [phone] with questions or concerns regarding your invoice.   Our billing staff will not be able to assist you with questions regarding bills from these companies.  You will be contacted with the lab results as soon as they are available. The fastest way to get your results is to activate your My Chart account. Instructions are located on the last page of this paperwork. If you have not heard from [place] regarding the results in 2 weeks, please contact this office.

## 2018-10-26 NOTE — Progress Notes (Signed)
Subjective:    Patient ID: Alejandro R Mallozzi, male    DOB: 01-21-1981, 38 y.o.   MRN: [id number]  HPI Alejandro R Lagrand is a 38 y.o. male Presents today for: Chief Complaint  Patient presents with  . Depression    f/u do not need a refill at this time but will need a refill in 3 week   Depression:  Sometimes does funny things in sleep. Jibberish/yelling at someone in sleep. Dreaming that he was punching and hand moved out. Occurred about 5-6 times total.does not feel like a big deal. otherwise doing ok at 60mg  dose. No new side effects. Feels like depression controlled with current dose. Rare exercise at present.  Not meeting with therapist recently.   Depression screen [facility] 2/9 10/26/2018 07/25/2018 02/17/2018 11/21/2017 10/14/2017  Decreased Interest 0 0 0 3 3  Down, Depressed, Hopeless 0 0 0 3 3  PHQ - 2 Score 0 0 0 6 6  Altered sleeping - - - 2 3  Tired, decreased energy - - - 2 3  Change in appetite - - - 0 1  Feeling bad or failure about yourself  - - - 2 3  Trouble concentrating - - - 3 3  Moving slowly or fidgety/restless - - - 3 1  Suicidal thoughts - - - 0 2  PHQ-9 Score - - - 18 22  Difficult doing work/chores - - - - Very difficult   Left low back pain:  Wakes up at night  with some soreness in back. Few weeks. NKI.  Does lift baby/stroller.  No bowel or bladder incontinence, no saddle anesthesia, no lower extremity weakness.  No night sweats/fever/chills, wt loss. No treatments.   ADHD: Currently on Mydayis. 50mg  QD. Occasional upset stomach in am only. No vomiting.  Controlling ADD symptoms.  Muscle soreness in chest with sleep only. No other chest pains.   Controlled substance database (PDMP) reviewed. No concerns appreciated. Last fill 10/12/18.    Patient Active Problem List   Diagnosis Date Noted  . CHRONIC PROSTATITIS 07/08/2008  . DYSURIA, HX OF 07/08/2008   Past Medical History:  Diagnosis Date  . Allergy   . Anxiety   . Depression    No past surgical  history on file. Allergies  Allergen Reactions  . Codeine Itching and Rash   Prior to Admission medications   Medication Sig Start Date End Date Taking? Authorizing Provider  FLUoxetine (PROZAC) 20 MG capsule Take 3 capsules (60 mg total) by mouth daily. 07/25/18  Yes Wendie Agreste, MD  ibuprofen (ADVIL,MOTRIN) 200 MG tablet Take 400 mg every 6 (six) hours as needed by mouth for headache.   Yes [provider]  loratadine (CLARITIN) 10 MG tablet Take 10 mg daily by mouth.   Yes [provider]  MYDAYIS 50 MG CP24 Take 50 mg by mouth daily. 10/12/18  Yes Wendie Agreste, MD   Social History   Socioeconomic History  . Marital status: Married    Spouse name: [name]  . Number of children: 2  . Years of education: Not on file  . Highest education level: Not on file  Occupational History  . Not on file  Social Needs  . Financial resource strain: Not on file  . Food insecurity:    Worry: Not on file    Inability: Not on file  . Transportation needs:    Medical: Not on file    Non-medical: Not on file  Tobacco Use  .  Smoking status: Former Smoker    Types: Cigarettes  . Smokeless tobacco: Never Used  Substance and Sexual Activity  . Alcohol use: Yes    Comment: once weekly   . Drug use: No  . Sexual activity: Yes  Lifestyle  . Physical activity:    Days per week: Not on file    Minutes per session: Not on file  . Stress: Not on file  Relationships  . Social connections:    Talks on phone: Not on file    Gets together: Not on file    Attends religious service: Not on file    Active member of club or organization: Not on file    Attends meetings of clubs or organizations: Not on file    Relationship status: Not on file  . Intimate partner violence:    Fear of current or ex partner: Not on file    Emotionally abused: Not on file    Physically abused: Not on file    Forced sexual activity: Not on file  Other Topics Concern  . Not on file  Social  History Narrative  . Not on file    Review of Systems  Constitutional: Negative for appetite change.  Respiratory: Negative for chest tightness.   Cardiovascular: Positive for chest pain (muscular only. ). Negative for palpitations.  Psychiatric/Behavioral: Negative for dysphoric mood and suicidal ideas. The patient is not nervous/anxious.        Objective:   Physical Exam Constitutional:      Appearance: He is well-developed.  HENT:     Head: Normocephalic and atraumatic.  Eyes:     Pupils: Pupils are equal, round, and reactive to light.  Cardiovascular:     Rate and Rhythm: Normal rate and regular rhythm.  No extrasystoles are present.    Heart sounds: Normal heart sounds. No murmur.  Pulmonary:     Effort: Pulmonary effort is normal.     Breath sounds: Normal breath sounds.  Musculoskeletal:     Comments: Locates area of discomfort to left lower paraspinals of lumbar spine but no midline bony tenderness or focal bony tenderness.  Full range of motion, pain-free exam   Skin:    General: Skin is warm and dry.  Neurological:     Mental Status: He is alert and oriented to person, place, and time.  Psychiatric:        Behavior: Behavior normal.        Thought Content: Thought content normal.        Judgment: Judgment normal.    Vitals:   10/26/18 1041 10/26/18 1045  BP: (!) 128/93 120/90  Pulse: (!) 102   Resp: 16   Temp: 99 F (37.2 C)   TempSrc: Oral   SpO2: 97%   Weight: 205 lb 9.6 oz (93.3 kg)   Height: 6\' 2"  (1.88 m)        Assessment & Plan:    Alejandro Johnson is a 38 y.o. male Acute left-sided low back pain without sciatica  -Suspected muscular pain, likely related to position/carrying.  No red flags on exam/history.  Recommended positional adjustment when carrying child, symptomatic care and RTC precautions discussed.  Depression, unspecified depression type - Plan: FLUoxetine (PROZAC) 20 MG capsule  -Overall stable.  Did discuss potential to meet with  sleep specialist if recurrent abnormal sleep movements.  No med changes for now.  Attention deficit disorder, unspecified hyperactivity presence  -Stable, continue same dose of Mydayis.  Meds ordered this encounter  Medications  . FLUoxetine (PROZAC) 20 MG capsule    Sig: Take 3 capsules (60 mg total) by mouth daily.    Dispense:  270 capsule    Refill:  1   Patient Instructions    Back pain may be muscular and from carrying child. Try changing position with carrying to see if that helps, occasional tylenol if needed. Heating pad at night ok for now.  If back pain is not improving the next few weeks or any worsening symptoms come back and see me sooner we can do x-rays or further work-up.   Try walking or other physical activity most day per week.   I am happy to refer you to sleep specialist at some point to evaluate the movements with sleep. Let me know if you would like to have that done, or can meet with your therapist to discuss the symptoms as well.  No medication changes for now, call for next refill for Mydayis, plan for follow-up office visit in 6 months.   Acute Back Pain, Adult Acute back pain is sudden and usually short-lived. It is often caused by an injury to the muscles and tissues in the back. The injury may result from:  A muscle or ligament getting overstretched or torn (strained). Ligaments are tissues that connect bones to each other. Lifting something improperly can cause a back strain.  Wear and tear (degeneration) of the spinal disks. Spinal disks are circular tissue that provides cushioning between the bones of the spine (vertebrae).  Twisting motions, such as while playing sports or doing yard work.  A hit to the back.  Arthritis. You may have a physical exam, lab tests, and imaging tests to find the cause of your pain. Acute back pain usually goes away with rest and home care. Follow these instructions at home: Managing pain, stiffness, and  swelling  Take over-the-counter and prescription medicines only as told by your health care provider.  Your health care provider may recommend applying ice during the first 24-48 hours after your pain starts. To do this: ? Put ice in a plastic bag. ? Place a towel between your skin and the bag. ? Leave the ice on for 20 minutes, 2-3 times a day.  If directed, apply heat to the affected area as often as told by your health care provider. Use the heat source that your health care provider recommends, such as a moist heat pack or a heating pad. ? Place a towel between your skin and the heat source. ? Leave the heat on for 20-30 minutes. ? Remove the heat if your skin turns bright red. This is especially important if you are unable to feel pain, heat, or cold. You have a greater risk of getting burned. Activity   Do not stay in bed. Staying in bed for more than 1-2 days can delay your recovery.  Sit up and stand up straight. Avoid leaning forward when you sit, or hunching over when you stand. ? If you work at a desk, sit close to it so you do not need to lean over. Keep your chin tucked in. Keep your neck drawn back, and keep your elbows bent at a right angle. Your arms should look like the letter "L." ? Sit high and close to the steering wheel when you drive. Add lower back (lumbar) support to your car seat, if needed.  Take short walks on even surfaces as soon as you are able. Try to increase the length of time  you walk each day.  Do not sit, drive, or stand in one place for more than 30 minutes at a time. Sitting or standing for long periods of time can put stress on your back.  Do not drive or use heavy machinery while taking prescription pain medicine.  Use proper lifting techniques. When you bend and lift, use positions that put less stress on your back: ? [place] your knees. ? Keep the load close to your body. ? Avoid twisting.  Exercise regularly as told by your health care  provider. Exercising helps your back heal faster and helps prevent back injuries by keeping muscles strong and flexible.  Work with a physical therapist to make a safe exercise program, as recommended by your health care provider. Do any exercises as told by your physical therapist. Lifestyle  Maintain a healthy weight. Extra weight puts stress on your back and makes it difficult to have good posture.  Avoid activities or situations that make you feel anxious or stressed. Stress and anxiety increase muscle tension and can make back pain worse. Learn ways to manage anxiety and stress, such as through exercise. General instructions  Sleep on a firm mattress in a comfortable position. Try lying on your side with your knees slightly bent. If you lie on your back, put a pillow under your knees.  Follow your treatment plan as told by your health care provider. This may include: ? Cognitive or behavioral therapy. ? Acupuncture or massage therapy. ? Meditation or yoga. Contact a health care provider if:  You have pain that is not relieved with rest or medicine.  You have increasing pain going down into your legs or buttocks.  Your pain does not improve after 2 weeks.  You have pain at night.  You lose weight without trying.  You have a fever or chills. Get help right away if:  You develop new bowel or bladder control problems.  You have unusual weakness or numbness in your arms or legs.  You develop nausea or vomiting.  You develop abdominal pain.  You feel faint. Summary  Acute back pain is sudden and usually short-lived.  Use proper lifting techniques. When you bend and lift, use positions that put less stress on your back.  Take over-the-counter and prescription medicines and apply heat or ice as directed by your health care provider. This information is not intended to replace advice given to you by your health care provider. Make sure you discuss any questions you have  with your health care provider. Document Released: 08/30/2005 Document Revised: 04/06/2018 Document Reviewed: 04/13/2017 Elsevier Interactive Patient Education  [employer].    If you have lab work done today you will be contacted with your lab results within the next 2 weeks.  If you have not heard from [place] then please contact us. The fastest way to get your results is to register for My Chart.   IF you received an x-ray today, you will receive an invoice from [facility]. Please contact [facility] at [phone] with questions or concerns regarding your invoice.   IF you received labwork today, you will receive an invoice from [place]. Please contact [facility] at [phone] with questions or concerns regarding your invoice.   Our billing staff will not be able to assist you with questions regarding bills from these companies.  You will be contacted with the lab results as soon as they are available. The fastest way to get your results is to activate your My  Chart account. Instructions are located on the last page of this paperwork. If you have not heard from [place] regarding the results in 2 weeks, please contact this office.       Signed,   [name], MD [facility] [place].  10/29/18 12:20 AM

## 2018-10-29 ENCOUNTER — Encounter: Payer: Self-pay | Admitting: Family Medicine

## 2018-11-12 ENCOUNTER — Other Ambulatory Visit: Payer: Self-pay | Admitting: Family Medicine

## 2018-11-13 MED ORDER — MYDAYIS 50 MG PO CP24: 50.0000 mg | ORAL_CAPSULE | Freq: Every day | ORAL | 0 refills | Status: DC

## 2018-11-13 NOTE — Telephone Encounter (Signed)
Recent office visit with stable symptoms.  Last refilled January 30.Controlled substance database (PDMP) reviewed. No concerns appreciated.  Refill granted

## 2018-11-14 MED FILL — MYDAYIS ER 50 MG CAPSULE: 50 | 30 days supply | Qty: 30 | Fill #0

## 2018-11-22 ENCOUNTER — Encounter: Payer: Self-pay | Admitting: Family Medicine

## 2018-11-22 MED FILL — FLUoxetine HCL 20 MG CAPS: 20 | 90 days supply | Qty: 270 | Fill #0

## 2018-12-12 ENCOUNTER — Other Ambulatory Visit: Payer: Self-pay | Admitting: Family Medicine

## 2018-12-12 ENCOUNTER — Encounter: Payer: Self-pay | Admitting: Family Medicine

## 2018-12-12 MED ORDER — MYDAYIS 50 MG PO CP24: 50.0000 mg | ORAL_CAPSULE | Freq: Every day | ORAL | 0 refills | Status: DC

## 2018-12-12 NOTE — Telephone Encounter (Signed)
Please Advise, Patient is requesting a refill of the following medications: Requested Prescriptions   Pending Prescriptions Disp Refills  . MYDAYIS 50 MG CP24 30 capsule 0    Sig: Take 50 mg by mouth daily.

## 2018-12-12 NOTE — Telephone Encounter (Signed)
Last filled 11/14/18 - prior 10/12/18.  Controlled substance database (PDMP) reviewed. No concerns appreciated.  Refill ordered to be filled next few days.

## 2018-12-14 ENCOUNTER — Other Ambulatory Visit: Payer: Self-pay | Admitting: Family Medicine

## 2018-12-14 MED ORDER — MYDAYIS 50 MG PO CP24: 50.0000 mg | ORAL_CAPSULE | Freq: Every day | ORAL | 0 refills | Status: DC

## 2018-12-14 MED FILL — MYDAYIS ER 50 MG CAPSULE: 50 | 30 days supply | Qty: 30 | Fill #0

## 2018-12-14 NOTE — Telephone Encounter (Signed)
Done

## 2019-01-11 ENCOUNTER — Other Ambulatory Visit: Payer: Self-pay | Admitting: Family Medicine

## 2019-01-11 MED ORDER — MYDAYIS 50 MG PO CP24: 50.0000 mg | ORAL_CAPSULE | Freq: Every day | ORAL | 0 refills | Status: DC

## 2019-01-11 MED FILL — MYDAYIS ER 50 MG CAPSULE: 50 | 30 days supply | Qty: 30 | Fill #0

## 2019-01-11 NOTE — Telephone Encounter (Signed)
Discussed 10/26/18.  Controlled substance database (PDMP) reviewed. No concerns appreciated.  Last filled [place] 12/14/18.  Refill ordered.

## 2019-02-11 ENCOUNTER — Other Ambulatory Visit: Payer: Self-pay | Admitting: Family Medicine

## 2019-02-12 MED ORDER — MYDAYIS 50 MG PO CP24: 50.0000 mg | ORAL_CAPSULE | Freq: Every day | ORAL | 0 refills | Status: DC

## 2019-02-12 MED FILL — MYDAYIS ER 50 MG CAPSULE: 50 | 30 days supply | Qty: 30 | Fill #0

## 2019-02-12 NOTE — Telephone Encounter (Signed)
Patient is requesting a refill of the following medications: Requested Prescriptions   Pending Prescriptions Disp Refills  . MYDAYIS 50 MG CP24 30 capsule 0    Sig: Take 50 mg by mouth daily.    Date of patient request: 02/11/19 Last office visit: 01/11/19 Date of last refill: 01/11/19 Last refill amount: 90 Follow up time period per chart: 04/23/19

## 2019-02-12 NOTE — Telephone Encounter (Signed)
Discussed in February. Controlled substance database (PDMP) reviewed. No concerns appreciated.  Last filled April 30.  Prescription refilled

## 2019-02-20 MED FILL — FLUoxetine HCL 20 MG CAPS: 20 | 90 days supply | Qty: 270 | Fill #1

## 2019-03-09 ENCOUNTER — Other Ambulatory Visit: Payer: Self-pay | Admitting: Family Medicine

## 2019-03-12 NOTE — Telephone Encounter (Signed)
Patient is requesting a refill of the following medications: Requested Prescriptions   Pending Prescriptions Disp Refills  . MYDAYIS 50 MG CP24 30 capsule 0    Sig: Take 50 mg by mouth daily.    Date of patient request: 03/09/2019 Last office visit: 10/26/2018 Date of last refill: 02/12/2019 Last refill amount: 30 Follow up time period per chart: 04/23/2019

## 2019-03-13 ENCOUNTER — Other Ambulatory Visit: Payer: Self-pay | Admitting: Family Medicine

## 2019-03-13 MED ORDER — MYDAYIS 50 MG PO CP24: 50.0000 mg | ORAL_CAPSULE | Freq: Every day | ORAL | 0 refills | Status: DC

## 2019-03-13 MED FILL — MYDAYIS ER 50 MG CAPSULE: 50 | 30 days supply | Qty: 30 | Fill #0

## 2019-03-13 NOTE — Telephone Encounter (Signed)
Please advise on medication it is controlled and it was last sent in April.

## 2019-03-13 NOTE — Telephone Encounter (Signed)
Office visit in February, last filled on June 1.  Refill ordered. Controlled substance database (PDMP) reviewed. No concerns appreciated.

## 2019-04-09 MED FILL — DIAZEPAM 10 MG TABS: 10 | 1 days supply | Qty: 2 | Fill #0

## 2019-04-11 ENCOUNTER — Other Ambulatory Visit: Payer: Self-pay | Admitting: Family Medicine

## 2019-04-11 MED ORDER — MYDAYIS 50 MG PO CP24: 50.0000 mg | ORAL_CAPSULE | Freq: Every day | ORAL | 0 refills | Status: DC

## 2019-04-11 NOTE — Telephone Encounter (Signed)
Controlled substance database (PDMP) reviewed. No concerns appreciated.  Last filled [place] 03/13/19. OV in February. Refill ordered.

## 2019-04-12 DIAGNOSIS — Z9852 Vasectomy status: Secondary | ICD-10-CM

## 2019-04-12 HISTORY — DX: Vasectomy status: Z98.52

## 2019-04-12 MED FILL — MYDAYIS ER 50 MG CAPSULE: 50 | 30 days supply | Qty: 30 | Fill #0

## 2019-04-23 ENCOUNTER — Ambulatory Visit: Payer: No Typology Code available for payment source | Admitting: Family Medicine

## 2019-04-27 ENCOUNTER — Encounter: Payer: Self-pay | Admitting: Family Medicine

## 2019-04-27 ENCOUNTER — Other Ambulatory Visit: Payer: Self-pay

## 2019-04-27 ENCOUNTER — Ambulatory Visit (INDEPENDENT_AMBULATORY_CARE_PROVIDER_SITE_OTHER): Payer: No Typology Code available for payment source | Admitting: Family Medicine

## 2019-04-27 VITALS — BP 138/80 | HR 100 | Temp 99.4°F | Resp 16 | Wt 210.4 lb

## 2019-04-27 DIAGNOSIS — F988 Other specified behavioral and emotional disorders with onset usually occurring in childhood and adolescence: Secondary | ICD-10-CM

## 2019-04-27 DIAGNOSIS — F329 Major depressive disorder, single episode, unspecified: Secondary | ICD-10-CM | POA: Diagnosis not present

## 2019-04-27 DIAGNOSIS — F32A Depression, unspecified: Secondary | ICD-10-CM

## 2019-04-27 MED ORDER — FLUOXETINE HCL 20 MG PO CAPS: 60.0000 mg | ORAL_CAPSULE | Freq: Every day | ORAL | 1 refills | Status: DC

## 2019-04-27 NOTE — Patient Instructions (Addendum)
  No med changes for now. Recheck in 6 months. Thanks for coming in today.  Stay safe.    If you have lab work done today you will be contacted with your lab results within the next 2 weeks.  If you have not heard from [place] then please contact us. The fastest way to get your results is to register for My Chart.   IF you received an x-ray today, you will receive an invoice from [facility]. Please contact [facility] at [phone] with questions or concerns regarding your invoice.   IF you received labwork today, you will receive an invoice from [place]. Please contact [facility] at [phone] with questions or concerns regarding your invoice.   Our billing staff will not be able to assist you with questions regarding bills from these companies.  You will be contacted with the lab results as soon as they are available. The fastest way to get your results is to activate your My Chart account. Instructions are located on the last page of this paperwork. If you have not heard from [place] regarding the results in 2 weeks, please contact this office.

## 2019-04-27 NOTE — Progress Notes (Signed)
Subjective:    Patient ID: Alejandro Johnson, male    DOB: 1981/07/04, 38 y.o.   MRN: [id number]  HPI Alejandro R Millon is a 38 y.o. male Presents today for: Chief Complaint  Patient presents with  . Follow-up    25month follow up. Need refill on prozac   Depression: Has been taking Prozac 60 mg daily which was stable at last visit in February.  Some abnormal sleep movements were discussed at that time with option of meeting with sleep specialist. Depression controlled.  Few times per month - movement that wakes up. No change in frequency.  Would like to remain same dose prozac.   dtr - Cecilia, almost 1.  Doing ok at home.    Depression screen [facility] 2/9 04/27/2019 10/26/2018 07/25/2018 02/17/2018 11/21/2017  Decreased Interest 0 0 0 0 3  Down, Depressed, Hopeless 0 0 0 0 3  PHQ - 2 Score 0 0 0 0 6  Altered sleeping - - - - 2  Tired, decreased energy - - - - 2  Change in appetite - - - - 0  Feeling bad or failure about yourself  - - - - 2  Trouble concentrating - - - - 3  Moving slowly or fidgety/restless - - - - 3  Suicidal thoughts - - - - 0  PHQ-9 Score - - - - 18  Difficult doing work/chores - - - - -    Attention deficit disorder: Has been controlled with Mydayis 50mg  qd.  Controlled substance database (PDMP) reviewed. No concerns appreciated.  Last filled July 30 for #30. Focus has been doing ok. 12-14 hour effectiveness, no insomnia. 5-8 am dosing.  No CP/palpitations.  Breakfast daily.  No IDU.  Alcohol - rare glass of wine.   Review of Systems  Per HPI      Objective:   Physical Exam Vitals signs reviewed.  Constitutional:      Appearance: He is well-developed.  HENT:     Head: Normocephalic and atraumatic.  Eyes:     Pupils: Pupils are equal, round, and reactive to light.  Neck:     Vascular: No carotid bruit or JVD.  Cardiovascular:     Rate and Rhythm: Normal rate and regular rhythm.     Heart sounds: Normal heart sounds. No murmur.  Pulmonary:     Effort:  Pulmonary effort is normal.     Breath sounds: Normal breath sounds. No rales.  Skin:    General: Skin is warm and dry.  Neurological:     Mental Status: He is alert and oriented to person, place, and time.    Vitals:   04/27/19 1055  BP: 138/80  Pulse: 100  Resp: 16  Temp: 99.4 F (37.4 C)  TempSrc: Oral  SpO2: 98%  Weight: 210 lb 6.4 oz (95.4 kg)        Assessment & Plan:   Alejandro R Fifita is a 38 y.o. male Attention deficit disorder, unspecified hyperactivity presence - Plan:   -stable.  Tolerating current regimen, continue Mydayis same dosing.  Will refill when ready for 3 month intervals with office visit in 6 months.  Depression, unspecified depression type - Plan: FLUoxetine (PROZAC) 20 MG capsule,  Stable, continue same regimen.  Discussed option of meeting with sleep specialist if continued nighttime movement issues but infrequent at present.  Meds ordered this encounter  Medications  . FLUoxetine (PROZAC) 20 MG capsule    Sig: Take 3 capsules (60 mg total) by  mouth daily.    Dispense:  270 capsule    Refill:  1   Patient Instructions    No med changes for now. Recheck in 6 months. Thanks for coming in today.  Stay safe.    If you have lab work done today you will be contacted with your lab results within the next 2 weeks.  If you have not heard from [place] then please contact us. The fastest way to get your results is to register for My Chart.   IF you received an x-ray today, you will receive an invoice from [facility]. Please contact [facility] at [phone] with questions or concerns regarding your invoice.   IF you received labwork today, you will receive an invoice from [facility]. Please contact [facility] at [phone] with questions or concerns regarding your invoice.   Our billing staff will not be able to assist you with questions regarding bills from these companies.  You will be contacted with the lab results as soon as they are  available. The fastest way to get your results is to activate your My Chart account. Instructions are located on the last page of this paperwork. If you have not heard from [place] regarding the results in 2 weeks, please contact this office.       Signed,   [name], MD [facility] [place].  04/27/19 12:27 PM

## 2019-05-11 ENCOUNTER — Other Ambulatory Visit: Payer: Self-pay | Admitting: Family Medicine

## 2019-05-11 MED ORDER — MYDAYIS 50 MG PO CP24: 50.0000 mg | ORAL_CAPSULE | Freq: Every day | ORAL | 0 refills | Status: DC

## 2019-05-11 NOTE — Telephone Encounter (Signed)
Last ov 8/14.Controlled substance database (PDMP) reviewed. No concerns appreciated. Last fill date 04/12/19. Refill ordered.

## 2019-05-11 NOTE — Telephone Encounter (Signed)
Please advise   Patient is requesting a refill of the following medications: Requested Prescriptions   Pending Prescriptions Disp Refills  . MYDAYIS 50 MG CP24 30 capsule 0    Sig: Take 50 mg by mouth daily.    Date of patient request: 05/11/19 Last office visit: 04/27/19 Date of last refill: 04/11/19 Last refill amount: 30 cap, 0 refills Follow up time period per chart: 11/03/18

## 2019-05-12 MED FILL — MYDAYIS ER 50 MG CAPSULE: 50 | 30 days supply | Qty: 30 | Fill #0

## 2019-05-14 ENCOUNTER — Other Ambulatory Visit: Payer: Self-pay | Admitting: Family Medicine

## 2019-05-23 MED FILL — FLUoxetine HCL 20 MG CAPS: 20 | 90 days supply | Qty: 270 | Fill #0

## 2019-06-11 ENCOUNTER — Other Ambulatory Visit: Payer: Self-pay | Admitting: Family Medicine

## 2019-06-12 ENCOUNTER — Other Ambulatory Visit: Payer: Self-pay | Admitting: Emergency Medicine

## 2019-06-12 ENCOUNTER — Telehealth: Payer: Self-pay | Admitting: Emergency Medicine

## 2019-06-12 DIAGNOSIS — F988 Other specified behavioral and emotional disorders with onset usually occurring in childhood and adolescence: Secondary | ICD-10-CM

## 2019-06-12 MED ORDER — MYDAYIS 50 MG PO CP24: 50.0000 mg | ORAL_CAPSULE | Freq: Every day | ORAL | 0 refills | Status: DC

## 2019-06-12 NOTE — Telephone Encounter (Signed)
Dr Carlota Raspberry patient would like a refill on Mydayis. I printed and approved by accident thinking this was a different medication. Do you approve for this this rx to be filled? If so can you send to pharm or print.

## 2019-06-12 NOTE — Addendum Note (Signed)
Addended by: [name] R on: 06/12/2019 09:15 PM   Modules accepted: Orders

## 2019-06-12 NOTE — Telephone Encounter (Addendum)
Office visit August 14.  Stable control of ADD symptoms at that time.  Continue same dose with plan for office visit in 6 months.  Controlled substance database (PDMP) reviewed. No concerns appreciated.  Last prescription filled August 29.  New prescription provided.

## 2019-06-13 MED FILL — MYDAYIS ER 50 MG CAPSULE: 50 | 30 days supply | Qty: 30 | Fill #0

## 2019-07-04 ENCOUNTER — Other Ambulatory Visit: Payer: Self-pay

## 2019-07-04 DIAGNOSIS — Z20822 Contact with and (suspected) exposure to covid-19: Secondary | ICD-10-CM

## 2019-07-05 LAB — NOVEL CORONAVIRUS, NAA: SARS-CoV-2, NAA: NOT DETECTED

## 2019-07-10 ENCOUNTER — Other Ambulatory Visit: Payer: Self-pay | Admitting: Family Medicine

## 2019-07-10 DIAGNOSIS — F988 Other specified behavioral and emotional disorders with onset usually occurring in childhood and adolescence: Secondary | ICD-10-CM

## 2019-07-10 MED ORDER — MYDAYIS 50 MG PO CP24: 50.0000 mg | ORAL_CAPSULE | Freq: Every day | ORAL | 0 refills | Status: DC

## 2019-07-10 NOTE — Telephone Encounter (Signed)
Patient is requesting a refill of the following medications: Requested Prescriptions   Pending Prescriptions Disp Refills  . MYDAYIS 50 MG CP24 30 capsule 0    Sig: Take 50 mg by mouth daily.    Date of patient request: 07/10/19 Last office visit: 04/27/19 Date of last refill: 06/12/19 Last refill amount: 30 Follow up time period per chart: 10/26/2019

## 2019-07-10 NOTE — Telephone Encounter (Signed)
Controlled substance database (PDMP) reviewed. No concerns appreciated. Last filled for # 30 on 9/30.  Discussed in August. Refill ordered.

## 2019-07-11 MED FILL — MYDAYIS ER 50 MG CAPSULE: 50 | 30 days supply | Qty: 30 | Fill #0

## 2019-08-09 ENCOUNTER — Other Ambulatory Visit: Payer: Self-pay | Admitting: Family Medicine

## 2019-08-09 DIAGNOSIS — F988 Other specified behavioral and emotional disorders with onset usually occurring in childhood and adolescence: Secondary | ICD-10-CM

## 2019-08-10 NOTE — Telephone Encounter (Signed)
Last filled 10/28 for # 30.  Controlled substance database (PDMP) reviewed. No concerns appreciated.  appt 8/14 with 6 month follow up planned. 3 months rx to be sent.

## 2019-08-10 NOTE — Telephone Encounter (Signed)
Please advise  Patient is requesting a refill of the following medications: Requested Prescriptions   Pending Prescriptions Disp Refills  . MYDAYIS 50 MG CP24 30 capsule 0    Sig: Take 50 mg by mouth daily.    Date of patient request: 08/10/19 Last office visit: 04/27/19 Date of last refill: 07/10/19 Last refill amount: 30 tab Follow up time period per chart: 10/26/19

## 2019-08-11 MED ORDER — MYDAYIS 50 MG PO CP24: 50.0000 mg | ORAL_CAPSULE | Freq: Every day | ORAL | 0 refills | Status: DC

## 2019-08-11 MED FILL — MYDAYIS ER 50 MG CAPSULE: 50 | 30 days supply | Qty: 30 | Fill #0

## 2019-08-16 MED FILL — FLUoxetine HCL 20 MG CAPS: 20 | 90 days supply | Qty: 270 | Fill #1

## 2019-09-11 MED FILL — MYDAYIS ER 50 MG CAPSULE: 50 | 30 days supply | Qty: 30 | Fill #0

## 2019-10-11 MED FILL — MYDAYIS ER 50 MG CAPSULE: 50 | 30 days supply | Qty: 30 | Fill #0

## 2019-10-26 ENCOUNTER — Other Ambulatory Visit: Payer: Self-pay

## 2019-10-26 ENCOUNTER — Encounter: Payer: Self-pay | Admitting: Family Medicine

## 2019-10-26 ENCOUNTER — Ambulatory Visit (INDEPENDENT_AMBULATORY_CARE_PROVIDER_SITE_OTHER): Payer: No Typology Code available for payment source | Admitting: Family Medicine

## 2019-10-26 VITALS — BP 190/112 | HR 87 | Temp 98.0°F | Ht 74.0 in | Wt 213.0 lb

## 2019-10-26 DIAGNOSIS — F32A Depression, unspecified: Secondary | ICD-10-CM

## 2019-10-26 DIAGNOSIS — R5383 Other fatigue: Secondary | ICD-10-CM | POA: Diagnosis not present

## 2019-10-26 DIAGNOSIS — F329 Major depressive disorder, single episode, unspecified: Secondary | ICD-10-CM | POA: Diagnosis not present

## 2019-10-26 DIAGNOSIS — Z5181 Encounter for therapeutic drug level monitoring: Secondary | ICD-10-CM

## 2019-10-26 DIAGNOSIS — F988 Other specified behavioral and emotional disorders with onset usually occurring in childhood and adolescence: Secondary | ICD-10-CM | POA: Diagnosis not present

## 2019-10-26 DIAGNOSIS — R030 Elevated blood-pressure reading, without diagnosis of hypertension: Secondary | ICD-10-CM

## 2019-10-26 DIAGNOSIS — Z23 Encounter for immunization: Secondary | ICD-10-CM

## 2019-10-26 MED ORDER — FLUOXETINE HCL 20 MG PO CAPS: 60.0000 mg | ORAL_CAPSULE | Freq: Every day | ORAL | 1 refills | Status: DC

## 2019-10-26 NOTE — Progress Notes (Signed)
Subjective:  Patient ID: Alejandro Johnson, male    DOB: 1981-01-09  Age: 39 y.o. MRN: [id number]  CC:  Chief Complaint  Patient presents with  . Follow-up    on pt's ADD and Depression. pt states he feels great neither of these conditions are giving him any issues at this time. pt states medication is working well with no side effects.    HPI Alejandro Johnson presents for   Attention deficit disorder: Stable when discussed August 14.  Was continued on Mydayis 50 mg daily. Previously treated with various meds at [facility]. This worked best.  Doing well for concentration/focus. Has not had new side effects, but no lower dose.  No mood changes. No sleep appetite/weight changes.  Wt Readings from Last 3 Encounters:  10/26/19 213 lb (96.6 kg)  04/27/19 210 lb 6.4 oz (95.4 kg)  10/26/18 205 lb 9.6 oz (93.3 kg)   Controlled substance database (PDMP) reviewed. No concerns appreciated.  Last prescription filled January 28, previously December 29, previously November 28.  No recent UDS.  Caffeine: red bull this morning. Usually 1-3 caffeinated drinks per day.  No recent decongestants. No IDU.  Has rarely had to take Adderall on days if out of [place]. Only taking if out of [place]. Later in visit - notes he does take one of his wife's adderall later at night about 6pm - 2-3 times per week because Mydayis wears off - feels tired. neds energy at that time for dinner, chores at home, responsibilities with kids. Taking Mydayis at 5am.   Elevated BP: BP Readings from Last 3 Encounters:  10/26/19 (!) 190/112  04/27/19 138/80  10/26/18 120/90  no home readings - wife is med Environmental consultant, she will check if needed.   Depression: Doing well at his 14th visit.  Continued on Prozac 60mg  at that time.  Did discuss possible sleep specialist evaluation with continued nighttime movement issues but infrequent symptoms at that time. Leg movements better -nearly gone Depression controlled.  Depression  screen [facility] 2/9 10/26/2019 04/27/2019 10/26/2018 07/25/2018 02/17/2018  Decreased Interest 0 0 0 0 0  Down, Depressed, Hopeless 0 0 0 0 0  PHQ - 2 Score 0 0 0 0 0  Altered sleeping 1 - - - -  Tired, decreased energy 0 - - - -  Change in appetite 0 - - - -  Feeling bad or failure about yourself  0 - - - -  Trouble concentrating 0 - - - -  Moving slowly or fidgety/restless 0 - - - -  Suicidal thoughts 0 - - - -  PHQ-9 Score 1 - - - -  Difficult doing work/chores - - - - -     History Patient Active Problem List   Diagnosis Date Noted  . CHRONIC PROSTATITIS 07/08/2008  . DYSURIA, HX OF 07/08/2008   Past Medical History:  Diagnosis Date  . Allergy   . Anxiety   . Depression   . History of vasectomy 04/12/2019   [facility]   Past Surgical History:  Procedure Laterality Date  . VASECTOMY     Allergies  Allergen Reactions  . Codeine Itching and Rash   Prior to Admission medications   Medication Sig Start Date End Date Taking? Authorizing Provider  FLUoxetine (PROZAC) 20 MG capsule Take 3 capsules (60 mg total) by mouth daily. 04/27/19  Yes Wendie Agreste, MD  ibuprofen (ADVIL,MOTRIN) 200 MG tablet Take 400 mg every 6 (six) hours as needed by mouth  for headache.   Yes [provider]  loratadine (CLARITIN) 10 MG tablet Take 10 mg daily by mouth.   Yes [provider]  MYDAYIS 50 MG CP24 Take 50 mg by mouth daily. 08/11/19  Yes Wendie Agreste, MD  MYDAYIS 50 MG CP24 Take 50 mg by mouth daily. Patient not taking: Reported on 10/26/2019 08/11/19   Wendie Agreste, MD  MYDAYIS 50 MG CP24 Take 50 mg by mouth daily. Patient not taking: Reported on 10/26/2019 08/11/19   Wendie Agreste, MD   Social History   Socioeconomic History  . Marital status: Married    Spouse name: [name]  . Number of children: 2  . Years of education: Not on file  . Highest education level: Not on file  Occupational History  . Not on file  Tobacco Use  . Smoking status:  Former Smoker    Types: Cigarettes  . Smokeless tobacco: Never Used  Substance and Sexual Activity  . Alcohol use: Yes    Comment: once weekly   . Drug use: No  . Sexual activity: Yes  Other Topics Concern  . Not on file  Social History Narrative  . Not on file   Social Determinants of Health   Financial Resource Strain:   . Difficulty of Paying Living Expenses: Not on file  Food Insecurity:   . Worried About Charity fundraiser in the Last Year: Not on file  . Ran Out of Food in the Last Year: Not on file  Transportation Needs:   . Lack of Transportation (Medical): Not on file  . Lack of Transportation (Non-Medical): Not on file  Physical Activity:   . Days of Exercise per Week: Not on file  . Minutes of Exercise per Session: Not on file  Stress:   . Feeling of Stress : Not on file  Social Connections:   . Frequency of Communication with Friends and Family: Not on file  . Frequency of Social Gatherings with Friends and Family: Not on file  . Attends Religious Services: Not on file  . Active Member of Clubs or Organizations: Not on file  . Attends Archivist Meetings: Not on file  . Marital Status: Not on file  Intimate Partner Violence:   . Fear of Current or Ex-Partner: Not on file  . Emotionally Abused: Not on file  . Physically Abused: Not on file  . Sexually Abused: Not on file    Review of Systems  Constitutional: Negative for appetite change, fatigue and unexpected weight change.  Eyes: Negative for visual disturbance.  Respiratory: Negative for cough, chest tightness and shortness of breath.   Cardiovascular: Negative for chest pain, palpitations and leg swelling.  Gastrointestinal: Negative for abdominal pain and blood in stool.  Neurological: Negative for dizziness, light-headedness and headaches.  Psychiatric/Behavioral: Negative for dysphoric mood and sleep disturbance. The patient is not nervous/anxious.      Objective:   Vitals:    10/26/19 1126 10/26/19 1133  BP: (!) 166/116 (!) 190/112  Pulse: 87   Temp: 98 F (36.7 C)   TempSrc: Temporal   SpO2: 97%   Weight: 213 lb (96.6 kg)   Height: 6\' 2"  (1.88 m)      Physical Exam Vitals reviewed.  Constitutional:      Appearance: He is well-developed.  HENT:     Head: Normocephalic and atraumatic.  Eyes:     Pupils: Pupils are equal, round, and reactive to light.  Neck:  Vascular: No carotid bruit or JVD.  Cardiovascular:     Rate and Rhythm: Normal rate and regular rhythm.     Heart sounds: Normal heart sounds. No murmur.  Pulmonary:     Effort: Pulmonary effort is normal.     Breath sounds: Normal breath sounds. No rales.  Skin:    General: Skin is warm and dry.  Neurological:     Mental Status: He is alert and oriented to person, place, and time.        Assessment & Plan:  Alejandro Johnson is a 39 y.o. male . Depression, unspecified depression type  -Well-controlled, continue fluoxetine 60 mg daily.   Need for prophylactic vaccination and inoculation against influenza - Plan: Flu Vaccine QUAD 6+ mos PF IM (Fluarix Quad PF)  Fatigue, unspecified type - Plan: TSH Attention deficit disorder, unspecified hyperactivity presence - Plan: ToxASSURE Select 13 (MW), Urine Medication monitoring encounter - Plan: ToxASSURE Select 13 (MW), Urine  -Previously noted control of symptoms at Mydayis 50 mg daily.  However on discussion of.  Testing today, did note episodic use of spouse's Adderall later in the evening few days per week for some onset of fatigue.  Does take initial dose fairly early in the morning.  However he is at max dose of Mydayis at this time, will recommend meeting with [place] [facility] again to decide on augmentation of regimen.  Check TSH with fatigue but unlikely cause.  Discuss further at video visit in 2 weeks  Elevated blood pressure reading - Plan: TSH, Basic metabolic panel  -Improved on recheck.  Decreased energy drinks  discussed, including if you start Mydayis.  Home monitoring planned, with video visit in 2 weeks.  Asymptomatic at this time with RTC precautions/ER precautions given.   No orders of the defined types were placed in this encounter.  Patient Instructions   No change in fluoxetine at this time. Keep Mydayis at 50mg  for now. I would like you to meet with [place] [facility].  As you were a previous patient, he should be able to call and schedule an appointment.  Let me know if a referral is needed.  Can decide on medication refills by next visit if they have not yet seen you. Avoid energy drinks for now. Keep a record of your blood pressures outside of the office for video visit.     If you have lab work done today you will be contacted with your lab results within the next 2 weeks.  If you have not heard from [place] then please contact us. The fastest way to get your results is to register for My Chart.   IF you received an x-ray today, you will receive an invoice from [facility]. Please contact [facility] at [phone] with questions or concerns regarding your invoice.   IF you received labwork today, you will receive an invoice from [place]. Please contact [facility] at [phone] with questions or concerns regarding your invoice.   Our billing staff will not be able to assist you with questions regarding bills from these companies.  You will be contacted with the lab results as soon as they are available. The fastest way to get your results is to activate your My Chart account. Instructions are located on the last page of this paperwork. If you have not heard from [place] regarding the results in 2 weeks, please contact this office.         Signed, [name], MD [facility]

## 2019-10-26 NOTE — Patient Instructions (Addendum)
No change in fluoxetine at this time. Keep Mydayis at 50mg  for now. I would like you to meet with [facility].  As you were a previous patient, should be able to call and schedule an appointment.  Let me know if a referral is needed.  Can decide on medication refills by next visit if they have not yet seen you. Avoid energy drinks for now. Keep a record of your blood pressures outside of the office for video visit in 2 weeks.   If you have lab work done today you will be contacted with your lab results within the next 2 weeks.  If you have not heard from [place] then please contact us. The fastest way to get your results is to register for My Chart.   IF you received an x-ray today, you will receive an invoice from [facility]. Please contact [facility] at [phone] with questions or concerns regarding your invoice.   IF you received labwork today, you will receive an invoice from [place]. Please contact [facility] at [phone] with questions or concerns regarding your invoice.   Our billing staff will not be able to assist you with questions regarding bills from these companies.  You will be contacted with the lab results as soon as they are available. The fastest way to get your results is to activate your My Chart account. Instructions are located on the last page of this paperwork. If you have not heard from [place] regarding the results in 2 weeks, please contact this office.

## 2019-10-27 LAB — BASIC METABOLIC PANEL
BUN/Creatinine Ratio: 9 (ref 9–20)
BUN: 11 mg/dL (ref 6–20)
CO2: 23 mmol/L (ref 20–29)
Calcium: 9.7 mg/dL (ref 8.7–10.2)
Chloride: 98 mmol/L (ref 96–106)
Creatinine, Ser: 1.24 mg/dL (ref 0.76–1.27)
GFR calc Af Amer: 84 mL/min/{1.73_m2} (ref >59)
GFR calc non Af Amer: 73 mL/min/{1.73_m2} (ref >59)
Glucose: 82 mg/dL (ref 65–99)
Potassium: 4.8 mmol/L (ref 3.5–5.2)
Sodium: 139 mmol/L (ref 134–144)

## 2019-10-27 LAB — TSH: TSH: 1.02 u[IU]/mL (ref 0.450–4.500)

## 2019-10-30 LAB — TOXASSURE SELECT 13 (MW), URINE

## 2019-10-30 MED FILL — FLUoxetine HCL 20 MG CAPS: 20 | 90 days supply | Qty: 270 | Fill #0

## 2019-11-03 ENCOUNTER — Encounter: Payer: Self-pay | Admitting: Family Medicine

## 2019-11-05 NOTE — Telephone Encounter (Signed)
Pt's wife called to report that husbands bp is ranging all over the place.   Reports pt is having dizziness and chest pain. They would like a cb for advise.

## 2019-11-06 ENCOUNTER — Ambulatory Visit: Payer: Self-pay | Admitting: Family Medicine

## 2019-11-06 NOTE — Telephone Encounter (Signed)
Pt reports BP few minutes prior to call 174/110. Saw Dr. Carlota Raspberry 10/26/2019, BP at visit 190/112.  Pt states instructed to monitor and CB. Values range from 159/95 at lowest and 164/113 highest. All but one diastolic [id number] over past week. Reports "Slight dizziness at times, mild headache at times" not presently. Called practice, Felicia, pt does have tele-visit tomorrow at 1640. Care advise given per protocol. Instructed to have BP values available for televisit. Pt verbalizes understanding.  Reason for Disposition . Systolic BP  >= 99991111 OR Diastolic >= A999333  Answer Assessment - Initial Assessment Questions 1. BLOOD PRESSURE: "What is the blood pressure?" "Did you take at least two measurements 5 minutes apart?"     174/110 2. ONSET: "When did you take your blood pressure?"    Few minutes ago 3. HOW: "How did you obtain the blood pressure?" (e.g., visiting nurse, automatic home BP monitor)     Home BP monitor arm cuff 4. HISTORY: "Do you have a history of high blood pressure?" Recent visit 10/26/2019     5. MEDICATIONS: "Are you taking any medications for blood pressure?" "Have you missed any doses recently?"    no 6. OTHER SYMPTOMS: "Do you have any symptoms?" (e.g., headache, chest pain, blurred vision, difficulty breathing, weakness)     "Slight dizziness at times."  Protocols used: HIGH BLOOD PRESSURE-A-AH

## 2019-11-07 ENCOUNTER — Telehealth: Payer: No Typology Code available for payment source | Admitting: Family Medicine

## 2019-11-07 ENCOUNTER — Emergency Department (HOSPITAL_COMMUNITY)
Admission: EM | Admit: 2019-11-07 | Discharge: 2019-11-07 | Disposition: A | Discharge status: Home / Self Care | Payer: No Typology Code available for payment source | Attending: Emergency Medicine | Admitting: Emergency Medicine

## 2019-11-07 ENCOUNTER — Other Ambulatory Visit: Payer: Self-pay

## 2019-11-07 ENCOUNTER — Encounter (HOSPITAL_COMMUNITY): Payer: Self-pay | Admitting: Emergency Medicine

## 2019-11-07 ENCOUNTER — Encounter: Payer: Self-pay | Admitting: Family Medicine

## 2019-11-07 DIAGNOSIS — Z87891 Personal history of nicotine dependence: Secondary | ICD-10-CM | POA: Insufficient documentation

## 2019-11-07 DIAGNOSIS — Z79899 Other long term (current) drug therapy: Secondary | ICD-10-CM | POA: Insufficient documentation

## 2019-11-07 DIAGNOSIS — I10 Essential (primary) hypertension: Secondary | ICD-10-CM | POA: Diagnosis not present

## 2019-11-07 DIAGNOSIS — R519 Headache, unspecified: Secondary | ICD-10-CM

## 2019-11-07 MED ORDER — ACETAMINOPHEN 500 MG PO TABS
1000.0000 mg | ORAL_TABLET | Freq: Once | ORAL | Status: AC
  Administered 2019-11-07: 1000 mg via ORAL
  Filled 2019-11-07: qty 2

## 2019-11-07 MED ORDER — AMLODIPINE BESYLATE 2.5 MG PO TABS: 2.5000 mg | ORAL_TABLET | Freq: Every day | ORAL | 0 refills | Status: DC

## 2019-11-07 MED FILL — AMLODIPINE 2.5 MG TABLET: 2.5 | 30 days supply | Qty: 30 | Fill #0

## 2019-11-07 NOTE — ED Provider Notes (Signed)
[facility] EMERGENCY DEPARTMENT Provider Note   CSN: [id number] Arrival date & time: 11/07/19  1234     History Chief Complaint  Patient presents with  . Hypertension    Alejandro Johnson is a 39 y.o. male.  Patient presents with concern of high blood pressure. States in past 1-2 weeks had several home readings that were high. Symptoms gradual onset, episodic, waxing/waning. Recently saw pcp w same, initial bp was high, but repeat better. He had outpatient labs, bmet and tsh normal. Patient  Notes strong family hx htn. Notes intermittently in past his bp has been high, but never required medication for same. This AM, gradual onset frontal headache, mild. No acute, abrupt or severe headaches. No associated neck pain/stiffness, no eye pain, no change in speech or vision, no numbness/weakness, no problems w coordination, gait or normal functional ability. No nv. Normal appetite. No fevers. No uri symptoms.   The history is provided by the patient.  Hypertension Associated symptoms include headaches. Pertinent negatives include no chest pain, no abdominal pain and no shortness of breath.       Past Medical History:  Diagnosis Date  . Allergy   . Anxiety   . Depression   . History of vasectomy 04/12/2019   [facility]    Patient Active Problem List   Diagnosis Date Noted  . CHRONIC PROSTATITIS 07/08/2008  . DYSURIA, HX OF 07/08/2008    Past Surgical History:  Procedure Laterality Date  . VASECTOMY         Family History  Problem Relation Age of Onset  . Mental illness Mother   . Arthritis Father   . Mental illness Maternal Grandmother     Social History   Tobacco Use  . Smoking status: Former Smoker    Types: Cigarettes  . Smokeless tobacco: Never Used  Substance Use Topics  . Alcohol use: Yes    Comment: once weekly   . Drug use: No    Home Medications Prior to Admission medications   Medication Sig Start Date End Date Taking? Authorizing Provider    FLUoxetine (PROZAC) 20 MG capsule Take 3 capsules (60 mg total) by mouth daily. 10/26/19   Wendie Agreste, MD  ibuprofen (ADVIL,MOTRIN) 200 MG tablet Take 400 mg every 6 (six) hours as needed by mouth for headache.    [provider]  loratadine (CLARITIN) 10 MG tablet Take 10 mg daily by mouth.    [provider]  MYDAYIS 50 MG CP24 Take 50 mg by mouth daily. Patient not taking: Reported on 10/26/2019 08/11/19 10/26/19  Wendie Agreste, MD    Allergies    Codeine  Review of Systems   Review of Systems  Constitutional: Negative for fever.  HENT: Negative for sinus pain.   Eyes: Negative for pain and visual disturbance.  Respiratory: Negative for cough and shortness of breath.   Cardiovascular: Negative for chest pain.  Gastrointestinal: Negative for abdominal pain, nausea and vomiting.  Genitourinary: Negative for flank pain.  Musculoskeletal: Negative for neck pain and neck stiffness.  Skin: Negative for rash.  Neurological: Positive for headaches. Negative for weakness and numbness.  Hematological: Does not bruise/bleed easily.  Psychiatric/Behavioral: Negative for confusion.    Physical Exam Updated Vital Signs BP (!) 156/105 (BP Location: Right Arm)   Pulse 80   Temp 98.5 F (36.9 C) (Oral)   Resp 16   Ht 1.88 m (6\' 2" )   Wt 96.6 kg   SpO2 100%   BMI  27.35 kg/m   Physical Exam Vitals and nursing note reviewed.  Constitutional:      Appearance: Normal appearance. He is well-developed.  HENT:     Head: Atraumatic.     Comments: No sinus or temporal tenderness.     Nose: Nose normal.     Mouth/Throat:     Mouth: Mucous membranes are moist.     Pharynx: Oropharynx is clear.  Eyes:     General: No scleral icterus.    Extraocular Movements: Extraocular movements intact.     Conjunctiva/sclera: Conjunctivae normal.     Pupils: Pupils are equal, round, and reactive to light.  Neck:     Trachea: No tracheal deviation.  Cardiovascular:      Rate and Rhythm: Normal rate and regular rhythm.     Pulses: Normal pulses.     Heart sounds: Normal heart sounds. No murmur. No friction rub. No gallop.   Pulmonary:     Effort: Pulmonary effort is normal. No accessory muscle usage or respiratory distress.     Breath sounds: Normal breath sounds.  Abdominal:     General: Bowel sounds are normal. There is no distension.     Palpations: Abdomen is soft. There is no mass.     Tenderness: There is no abdominal tenderness. There is no guarding.     Comments: No bruits.   Genitourinary:    Comments: No cva tenderness. Musculoskeletal:        General: No swelling.     Cervical back: Normal range of motion and neck supple. No rigidity.     Right lower leg: No edema.     Left lower leg: No edema.  Skin:    General: Skin is warm and dry.     Findings: No rash.  Neurological:     Mental Status: He is alert.     Comments: Alert, speech clear/normal. Motor intact bil, stre 5/5, no pronator drift. Sensation grossly intact. Steady gait.   Psychiatric:        Mood and Affect: Mood normal.     ED Results / Procedures / Treatments   Labs (all labs ordered are listed, but only abnormal results are displayed) Labs Reviewed - No data to display  EKG None  Radiology No results found.  Procedures Procedures (including critical care time)  Medications Ordered in ED Medications  acetaminophen (TYLENOL) tablet 1,000 mg (has no administration in time range)    ED Course  I have reviewed the triage vital signs and the nursing notes.  Pertinent labs & imaging results that were available during my care of the patient were reviewed by me and considered in my medical decision making (see chart for details).    MDM Rules/Calculators/A&P                      Patient indicates took baby asa earlier, but no other meds.   Acetaminophen po.   Reviewed nursing notes and prior charts for additional history. Recent pcp visit for same reviewed -  bmet, tsh normal.   Recheck bp.  Bp sl improved, but remains elevated.   Will give rx low dose amlodipine, rec pcp f/u.   Patient currently appears stable for d/c.       Final Clinical Impression(s) / ED Diagnoses Final diagnoses:  None    Rx / DC Orders ED Discharge Orders    None       Lajean Saver, MD 11/07/19 1342

## 2019-11-07 NOTE — Discharge Instructions (Addendum)
It was our pleasure to provide your ER care today - we hope that you feel better.  Take blood pressure medication as prescribed. SEE hypertension information and DASH eating plan. Minimize caffeine use, avoid adding extra salt to foods.   Follow up with primary care doctor in the next 1-2 weeks.   Return to ER if worse, new symptoms, chest pain, trouble breathing, severe head pain, or other concern.

## 2019-11-07 NOTE — ED Triage Notes (Signed)
Patient reports hypertension and headache, onset of symptoms 2 weeks ago.156/106 in Triage.

## 2019-11-07 NOTE — Telephone Encounter (Signed)
Pt is in the hospital in [place] and will follow up with [place] after being discharged.

## 2019-11-09 ENCOUNTER — Other Ambulatory Visit: Payer: Self-pay | Admitting: Family Medicine

## 2019-11-09 ENCOUNTER — Encounter: Payer: Self-pay | Admitting: Family Medicine

## 2019-11-09 NOTE — Telephone Encounter (Signed)
Requested medication (s) are due for refill today: Not clear  Requested medication (s) are on the active medication list: Yes  Last refill:  Not in chart  Future visit scheduled: No  Notes to clinic:  See request.    Requested Prescriptions  Pending Prescriptions Disp Refills   MYDAYIS 50 MG CP24 [Pharmacy Med Name: MYDAYIS ER 50 MG CAPSULE 50 Capsule] 30 capsule 0    Sig: TAKE 1 CAPSULE BY MOUTH ONCE DAILY. FILL IN 60 DAYS      Not Delegated - Psychiatry:  Stimulants/ADHD Failed - 11/09/2019  3:26 PM      Failed - This refill cannot be delegated      Failed - Urine Drug Screen completed in last 360 days.      Passed - Valid encounter within last 3 months    Recent Outpatient Visits           2 weeks ago Depression, unspecified depression type   [facility], Ranell Patrick, MD   6 months ago Attention deficit disorder, unspecified hyperactivity presence   [facility], Ranell Patrick, MD   1 year ago Acute left-sided low back pain without sciatica   [facility], Ranell Patrick, MD   1 year ago Depression, unspecified depression type   [facility], Ranell Patrick, MD   1 year ago Annual physical exam   [facility], Ranell Patrick, MD

## 2019-11-12 ENCOUNTER — Telehealth: Payer: Self-pay | Admitting: Family Medicine

## 2019-11-12 NOTE — Telephone Encounter (Signed)
Pt wife called regarding med refill  Please advise.

## 2019-11-12 NOTE — Telephone Encounter (Signed)
Patient is requesting historical medication that is already pended for PCP to review

## 2019-11-12 NOTE — Telephone Encounter (Unsigned)
Copied from [place] [phone]. Topic: Quick Communication - Rx Refill/Question >> Nov 12, 2019  1:05 PM Mcneil, Ja-Kwan wrote: Medication: Amphet-Dextroamphet 3-Bead ER (MYDAYIS) 50 MG CP24  Has the patient contacted their pharmacy? yes   Preferred Pharmacy (with phone number or street name): [place], [place] Phone: [phone]  Fax: [phone]  Agent: Please be advised that RX refills may take up to 3 business days. We ask that you follow-up with your pharmacy.

## 2019-11-12 NOTE — Telephone Encounter (Signed)
Patient is requesting a refill of the following medications: Requested Prescriptions   Pending Prescriptions Disp Refills  . MYDAYIS 50 MG CP24 [Pharmacy Med Name: MYDAYIS ER 50 MG CAPSULE 50 Capsule] 30 capsule 0    Sig: TAKE 1 CAPSULE BY MOUTH ONCE DAILY. FILL IN 60 DAYS    Date of patient request: 11/12/19 Last office visit: 10/26/19 Date of last refill: 11/07/2019 Last refill amount:  Follow up time period per chart: appointment scheduled

## 2019-11-12 NOTE — Telephone Encounter (Signed)
What is the name of the medication?Amphet-Dextroamphet 3-Bead ER (MYDAYIS) 50 MG CP24   Have you contacted your pharmacy to request a refill? Yes   Which pharmacy would you like this sent to ?   [place], [place]   Patient notified that their request is being sent to the clinical staff for review and that they should receive a call once it is complete. If they do not receive a call within 72 hours they can check with their pharmacy or our office.    Out of medication   * pt wife stated that he has had to go to the er due to his bp levels being irregular .  (scheduled appt )

## 2019-11-13 ENCOUNTER — Telehealth: Payer: Self-pay | Admitting: Family Medicine

## 2019-11-13 MED FILL — FLUoxetine HCL 20 MG CAPS: 20 | 90 days supply | Qty: 270 | Fill #0

## 2019-11-13 MED FILL — MYDAYIS ER 50 MG CAPSULE: 50 | 30 days supply | Qty: 30 | Fill #0

## 2019-11-13 NOTE — Telephone Encounter (Signed)
Controlled substance database (PDMP) reviewed. No concerns appreciated. Last filled 10/11/19. Refill ordered. Message to be sent to patient as well.

## 2019-11-13 NOTE — Telephone Encounter (Signed)
[facility] was unable to fill  MYDAYIS 50 MG CP24 [   because of note at bottom that says fill in 60 days . Any questions call [phone]  This u

## 2019-11-16 ENCOUNTER — Inpatient Hospital Stay: Payer: No Typology Code available for payment source | Admitting: Family Medicine

## 2019-11-16 ENCOUNTER — Encounter: Payer: Self-pay | Admitting: Family Medicine

## 2019-11-16 ENCOUNTER — Other Ambulatory Visit: Payer: Self-pay

## 2019-11-16 ENCOUNTER — Ambulatory Visit (INDEPENDENT_AMBULATORY_CARE_PROVIDER_SITE_OTHER): Payer: No Typology Code available for payment source | Admitting: Family Medicine

## 2019-11-16 VITALS — BP 140/96 | HR 104 | Temp 98.5°F | Ht 74.0 in | Wt 213.0 lb

## 2019-11-16 DIAGNOSIS — F988 Other specified behavioral and emotional disorders with onset usually occurring in childhood and adolescence: Secondary | ICD-10-CM | POA: Diagnosis not present

## 2019-11-16 DIAGNOSIS — F439 Reaction to severe stress, unspecified: Secondary | ICD-10-CM | POA: Diagnosis not present

## 2019-11-16 DIAGNOSIS — I10 Essential (primary) hypertension: Secondary | ICD-10-CM

## 2019-11-16 MED ORDER — AMLODIPINE BESYLATE 10 MG PO TABS: 10.0000 mg | ORAL_TABLET | Freq: Every day | ORAL | 1 refills | Status: DC

## 2019-11-16 MED FILL — AMLODIPINE BESYLATE 10 MG T: 10 | 90 days supply | Qty: 90 | Fill #0

## 2019-11-16 NOTE — Patient Instructions (Addendum)
  Keep a record of your blood pressures outside of the office and bring them to the next office visit along with your meter.   Try 10mg  amlodipine. If lightheaded, dizzy or new side effects at that dose, can try 7.5 mg instead.  Recheck in 6 weeks, let me know how things are going the next few weeks and I will watch for notes from [place] [facility].    If you have lab work done today you will be contacted with your lab results within the next 2 weeks.  If you have not heard from [place] then please contact us. The fastest way to get your results is to register for My Chart.   IF you received an x-ray today, you will receive an invoice from [facility]. Please contact [facility] at [phone] with questions or concerns regarding your invoice.   IF you received labwork today, you will receive an invoice from [place]. Please contact [facility] at [phone] with questions or concerns regarding your invoice.   Our billing staff will not be able to assist you with questions regarding bills from these companies.  You will be contacted with the lab results as soon as they are available. The fastest way to get your results is to activate your My Chart account. Instructions are located on the last page of this paperwork. If you have not heard from [place] regarding the results in 2 weeks, please contact this office.

## 2019-11-16 NOTE — Progress Notes (Signed)
Subjective:  Patient ID: Alejandro Johnson, male    DOB: 29-Jan-1981  Age: 39 y.o. MRN: [id number]  CC:  Chief Complaint  Patient presents with  . Hospitalization Follow-up    pt was hospitalized for his hypertension and a head ache. pt states he feel alot better since leaving the hospital and with the change in the medication.    HPI Alejandro R Rochin presents for   Hypertension: Elevated readings at last visit but did improve on recheck.  Plan on decreasing energy drinks, home monitoring with video visit follow-up planned.  Basic metabolic panel, TSH normal.  Urine drug screen positive for amphetamine only that day, consistent with his use of ADD medication.  Ultimately was seen in the ER on February 24 with elevated blood pressure as well as headache.  Started on amlodipine 2.5 mg, see recent messages, higher dose of 5 mg recommended. We did discuss avoiding additional doses of Adderall in addition to his Mydayis at last visit.  Plan for follow-up with [place] [facility]. - appt on 3/26.  Only took Adderall on days out of meds.  Has Mydayis - filled 3/2. #30 of 50mg  ER.   Home readings recently: none on 5mg  dose. Feels better. No recnt headache. no chest pain. Increased stress with BP and ER visit, stress of higher readings.  Has changed diet - Dash diet plan - trying to incorporating.  No alcohol.  No tobacco, no IDU.  Mom with HTN, uncle, aunt also with HTN.  No new side effects or leg swelling.  Feels better, sleeping better.      Home readings: BP Readings from Last 3 Encounters:  11/16/19 (!) 140/96  11/07/19 (!) 154/108  10/26/19 (!) 190/112   Lab Results  Component Value Date   CREATININE 1.24 10/26/2019       History Patient Active Problem List   Diagnosis Date Noted  . ADHD (attention deficit hyperactivity disorder) 02/11/2018  . CHRONIC PROSTATITIS 07/08/2008  . DYSURIA, HX OF 07/08/2008   Past Medical History:  Diagnosis Date  . Allergy   . Anxiety    . Depression   . History of vasectomy 04/12/2019   [facility]   Past Surgical History:  Procedure Laterality Date  . VASECTOMY     Allergies  Allergen Reactions  . Codeine Itching and Rash   Prior to Admission medications   Medication Sig Start Date End Date Taking? Authorizing Provider  amLODipine (NORVASC) 2.5 MG tablet Take 1 tablet (2.5 mg total) by mouth daily. Patient taking differently: Take 5 mg by mouth daily.  11/07/19  Yes Lajean Saver, MD  FLUoxetine (PROZAC) 20 MG capsule Take 3 capsules (60 mg total) by mouth daily. 10/26/19  Yes Wendie Agreste, MD  ibuprofen (ADVIL,MOTRIN) 200 MG tablet Take 400 mg every 6 (six) hours as needed by mouth for headache.   Yes [provider]  loratadine (CLARITIN) 10 MG tablet Take 10 mg daily by mouth.   Yes [provider]  MYDAYIS 50 MG CP24 TAKE 1 CAPSULE BY MOUTH ONCE DAILY. FILL IN 60 DAYS 11/13/19  Yes Wendie Agreste, MD   Social History   Socioeconomic History  . Marital status: Married    Spouse name: [name]  . Number of children: 2  . Years of education: Not on file  . Highest education level: Not on file  Occupational History  . Not on file  Tobacco Use  . Smoking status: Former Smoker    Types: Cigarettes  .  Smokeless tobacco: Never Used  Substance and Sexual Activity  . Alcohol use: Yes    Comment: once weekly   . Drug use: No  . Sexual activity: Yes  Other Topics Concern  . Not on file  Social History Narrative  . Not on file   Social Determinants of Health   Financial Resource Strain:   . Difficulty of Paying Living Expenses: Not on file  Food Insecurity:   . Worried About Charity fundraiser in the Last Year: Not on file  . Ran Out of Food in the Last Year: Not on file  Transportation Needs:   . Lack of Transportation (Medical): Not on file  . Lack of Transportation (Non-Medical): Not on file  Physical Activity:   . Days of Exercise per Week: Not on file  . Minutes of  Exercise per Session: Not on file  Stress:   . Feeling of Stress : Not on file  Social Connections:   . Frequency of Communication with Friends and Family: Not on file  . Frequency of Social Gatherings with Friends and Family: Not on file  . Attends Religious Services: Not on file  . Active Member of Clubs or Organizations: Not on file  . Attends Archivist Meetings: Not on file  . Marital Status: Not on file  Intimate Partner Violence:   . Fear of Current or Ex-Partner: Not on file  . Emotionally Abused: Not on file  . Physically Abused: Not on file  . Sexually Abused: Not on file    Review of Systems  Constitutional: Negative for fatigue and unexpected weight change.  Eyes: Negative for visual disturbance.  Respiratory: Negative for cough, chest tightness and shortness of breath.   Cardiovascular: Negative for chest pain, palpitations and leg swelling.  Gastrointestinal: Negative for abdominal pain and blood in stool.  Neurological: Negative for dizziness, light-headedness and headaches.     Objective:   Vitals:   11/16/19 1024 11/16/19 1029  BP: (!) 148/103 (!) 140/96  Pulse: (!) 104   Temp: 98.5 F (36.9 C)   TempSrc: Temporal   SpO2: 96%   Weight: 213 lb (96.6 kg)   Height: 6\' 2"  (1.88 m)      Physical Exam Vitals reviewed.  Constitutional:      Appearance: He is well-developed.  HENT:     Head: Normocephalic and atraumatic.  Eyes:     Pupils: Pupils are equal, round, and reactive to light.  Neck:     Vascular: No carotid bruit or JVD.  Cardiovascular:     Rate and Rhythm: Normal rate and regular rhythm.     Heart sounds: Normal heart sounds. No murmur.  Pulmonary:     Effort: Pulmonary effort is normal.     Breath sounds: Normal breath sounds. No rales.  Skin:    General: Skin is warm and dry.  Neurological:     Mental Status: He is alert and oriented to person, place, and time.        Assessment & Plan:  Alejandro R Esqueda is a 39 y.o.  male . Attention deficit disorder, unspecified hyperactivity presence  -Now on just Mydayis.  Did get refill on March 2.  No concerns on controlled substance database reviewed.  Has appointment with [place] [facility] within the month to evaluate regimen.  Avoiding additional Adderall.  Essential hypertension  -New diagnosis, ER evaluation reviewed, previous BMP and TSH reassuring.  Significant improvement in symptoms with improvement in blood pressure.  Still  somewhat elevated.  Will try increase from 5 to 10 mg of amlodipine with potential side effects discussed.  Option of 7.5 mg if that is too strong.  Recheck 6 weeks  Situational stress  -Hypertension, ER visit.  Improving at this time.  Continued on Prozac for depression.  RTC precautions.  No orders of the defined types were placed in this encounter.  Patient Instructions    Keep a record of your blood pressures outside of the office and bring them to the next office visit along with your meter.   Try 10mg  amlodipine. If lightheaded, dizzy or new side effects at that dose, can try 7.5 mg instead.  Recheck in 6 weeks, let me know how things are going the next few weeks and I will watch for notes from [place] [facility].    If you have lab work done today you will be contacted with your lab results within the next 2 weeks.  If you have not heard from [place] then please contact us. The fastest way to get your results is to register for My Chart.   IF you received an x-ray today, you will receive an invoice from [facility]. Please contact [facility] at [phone] with questions or concerns regarding your invoice.   IF you received labwork today, you will receive an invoice from [place]. Please contact [facility] at [phone] with questions or concerns regarding your invoice.   Our billing staff will not be able to assist you with questions regarding bills from these companies.  You will be  contacted with the lab results as soon as they are available. The fastest way to get your results is to activate your My Chart account. Instructions are located on the last page of this paperwork. If you have not heard from [place] regarding the results in 2 weeks, please contact this office.         Signed, [name], MD [facility]

## 2019-11-19 ENCOUNTER — Encounter: Payer: Self-pay | Admitting: Family Medicine

## 2019-11-19 NOTE — Telephone Encounter (Deleted)
Pt is having light headed and dizzy spells on Norvasc lasting about 1 hour pt wants to know if there is something he can do to help with this or if theres another medication he can take.

## 2019-11-20 NOTE — Telephone Encounter (Signed)
Pt having light headed spels lasting a few min usually following a period of increased activity. Following the mounting of a TV he got a nose bleed. Unsure how to proceed, please advise

## 2019-11-23 ENCOUNTER — Other Ambulatory Visit: Payer: Self-pay

## 2019-11-23 ENCOUNTER — Ambulatory Visit (INDEPENDENT_AMBULATORY_CARE_PROVIDER_SITE_OTHER): Payer: No Typology Code available for payment source | Admitting: Family Medicine

## 2019-11-23 ENCOUNTER — Encounter: Payer: Self-pay | Admitting: Family Medicine

## 2019-11-23 VITALS — BP 142/96 | HR 85 | Temp 98.1°F | Ht 74.0 in | Wt 211.0 lb

## 2019-11-23 DIAGNOSIS — I10 Essential (primary) hypertension: Secondary | ICD-10-CM | POA: Diagnosis not present

## 2019-11-23 DIAGNOSIS — R5383 Other fatigue: Secondary | ICD-10-CM | POA: Diagnosis not present

## 2019-11-23 DIAGNOSIS — R040 Epistaxis: Secondary | ICD-10-CM

## 2019-11-23 DIAGNOSIS — R400 Somnolence: Secondary | ICD-10-CM

## 2019-11-23 DIAGNOSIS — R42 Dizziness and giddiness: Secondary | ICD-10-CM | POA: Diagnosis not present

## 2019-11-23 DIAGNOSIS — R0683 Snoring: Secondary | ICD-10-CM | POA: Diagnosis not present

## 2019-11-23 LAB — POCT CBC
Granulocyte percent: 75.7 %G (ref 37–80)
HCT, POC: 45.7 % — AB (ref 29–41)
Hemoglobin: 15.2 g/dL — AB (ref 11–14.6)
Lymph, poc: 1.1 (ref 0.6–3.4)
MCH, POC: 28.3 pg (ref 27–31.2)
MCHC: 33.2 g/dL (ref 31.8–35.4)
MCV: 85.3 fL (ref 76–111)
MID (cbc): 0.6 (ref 0–0.9)
MPV: 6.6 fL (ref 0–99.8)
POC Granulocyte: 5.5 (ref 2–6.9)
POC LYMPH PERCENT: 15.4 %L (ref 10–50)
POC MID %: 8.9 %M (ref 0–12)
Platelet Count, POC: 402 10*3/uL (ref 142–424)
RBC: 5.35 M/uL (ref 4.69–6.13)
RDW, POC: 12.9 %
WBC: 7.3 10*3/uL (ref 4.6–10.2)

## 2019-11-23 NOTE — Patient Instructions (Addendum)
Saline nasal spray for now to help prevent nosebleed for now. If those continue into next week, I can refer you to ENT to look into other causes.  See other information below. Return to the clinic or go to the nearest emergency room if any of your symptoms worsen or new symptoms occur.  With new development of high blood pressure along with snoring, there is a possibility of obstructive sleep apnea.  I will refer you to sleep specialist to obtain further testing.  I think it is reasonable to switch the amlodipine dosing to bedtime for the next few days.  If you are still having some lightheadedness, dizziness or fatigue episodes during the day, I think we should change to a different medication.  Let me know how things are going the next few days as well as home readings on change of dosing to bedtime.    If we decide to start a new medication, I would consider an ACE inhibitor like lisinopril, but would want to closely monitor your kidney function to see if other testing needed for high blood pressure or secondary causes of high blood pressure.  Recheck in 1 week.   Return to the clinic or go to the nearest emergency room if any of your symptoms worsen or new symptoms occur.      Nosebleed, Adult A nosebleed is when blood comes out of the nose. Nosebleeds are common. Usually, they are not a sign of a serious condition. Nosebleeds can happen if a small blood vessel in your nose starts to bleed or if the lining of your nose (mucous membrane) cracks. They are commonly caused by:  Allergies.  Colds.  Picking your nose.  Blowing your nose too hard.  An injury from sticking an object into your nose or getting hit in the nose.  Dry or cold air. Less common causes of nosebleeds include:  Toxic fumes.  Something abnormal in the nose or in the air-filled spaces in the bones of the face (sinuses).  Growths in the nose, such as polyps.  Medicines or conditions that cause blood to clot  slowly.  Certain illnesses or procedures that irritate or dry out the nasal passages. Follow these instructions at home: When you have a nosebleed:   Sit down and tilt your head slightly forward.  Use a clean towel or tissue to pinch your nostrils under the bony part of your nose. After 10 minutes, let go of your nose and see if bleeding starts again. Do not release pressure before that time. If there is still bleeding, repeat the pinching and holding for 10 minutes until the bleeding stops.  Do not place tissues or gauze in the nose to stop bleeding.  Avoid lying down and avoid tilting your head backward. That may make blood collect in the throat and cause gagging or coughing.  Use a nasal spray decongestant to help with a nosebleed as told by your health care provider.  Do not use petroleum jelly or mineral oil in your nose. It can drip into your lungs. After a nosebleed:  Avoid blowing your nose or sniffing for a number of hours.  Avoid straining, lifting, or bending at the waist for several days. You may resume other normal activities as you are able.  Use saline spray or a humidifier as told by your health care provider.  Aspirinand blood thinners make bleeding more likely. If you are prescribed these medicines and you suffer from nosebleeds: ? Ask your health care provider  if you should stop taking the medicines or if you should adjust the dose. ? Do not stop taking medicines that your health care provider has recommended unless told by your health care provider.  If your nosebleed was caused by dry mucous membranes, use over-the-counter saline nasal spray or gel. This will keep the mucous membranes moist and allow them to heal. If you must use a lubricant: ? Choose one that is water-soluble. ? Use only as much as you need and use it only as often as needed. ? Do not lie down until several hours after you use it. Contact a health care provider if:  You have a fever.  You  get nosebleeds often or more often than usual.  You bruise very easily.  You have a nosebleed from having something stuck in your nose.  You have bleeding in your mouth.  You vomit or cough up brown material.  You have a nosebleed after you start a new medicine. Get help right away if:  You have a nosebleed after a fall or a head injury.  Your nosebleed does not go away after 20 minutes.  You feel dizzy or weak.  You have unusual bleeding from other parts of your body.  You have unusual bruising on other parts of your body.  You become sweaty.  You vomit blood. This information is not intended to replace advice given to you by your health care provider. Make sure you discuss any questions you have with your health care provider. Document Revised: 11/29/2017 Document Reviewed: 03/16/2016 Elsevier Patient Education  [employer].    If you have lab work done today you will be contacted with your lab results within the next 2 weeks.  If you have not heard from [place] then please contact us. The fastest way to get your results is to register for My Chart.   IF you received an x-ray today, you will receive an invoice from [facility] Radiology. Please contact [facility] Radiology at [phone] with questions or concerns regarding your invoice.   IF you received labwork today, you will receive an invoice from [place]. Please contact [facility] at [phone] with questions or concerns regarding your invoice.   Our billing staff will not be able to assist you with questions regarding bills from these companies.  You will be contacted with the lab results as soon as they are available. The fastest way to get your results is to activate your My Chart account. Instructions are located on the last page of this paperwork. If you have not heard from [place] regarding the results in 2 weeks, please contact this office.

## 2019-11-23 NOTE — Progress Notes (Signed)
Subjective:  Patient ID: Alejandro Johnson, male    DOB: [birth year]  Age: 39 y.o. MRN: [id number]  CC:  Chief Complaint  Patient presents with  . Follow-up    pt states his BP has been elevated. pt states his BP has been staying in the 140/90 range. pt has felt dizzy, fatigue, and some pressure at the front of his head.  . nose bleeds    started on tuesday. pt hass had a nose bleed every day since. pt is able to stop them shortly after they start. know known trigger. pt states his BP has been the same during the bleeds.    HPI Alejandro R Dwyer presents for   Hypertension: Follow-up from 1 week ago.  Had been seen in the ER February 24 with elevated blood pressure, headache, started on amlodipine 2.5 mg.  Has been up to 5 mg when I saw him last visit, still some elevated readings.  He did feel better on the 5 mg dose then 2.5 mg.  Option of 10 mg dosing, but see my chart messages, did have some lightheadedness, dizziness - felt shaky/dizzy, fatigue. Felt sleepy.  Cut back to 7.5mg  for one day - still felt shaky/sleepy/lightheaded, returned to 5mg  dose past 3 days. Less shaky/fatigue. Does feel weak/dizzy with activity - cleaning, chasing daughter, or other exertion. Not at rest. No chest pains. No dyspnea. No palpitations.  Snores at night- noticed by wife. unknown if pauses. Some daytime sleepiness at times. Weight up 10# with pandemic, decreased since last visit.  No prior testing for sleep apnea.   Drinking more water - 1 soda per day or less.  No alcohol.  Not feeling anxious/stressed. Spouse is concerned about new devt of HTN and other symptoms.  Has remained on mydayis 50mg  qd - no additional doses or other stimulants. No missed doses.   Wt Readings from Last 3 Encounters:  11/23/19 211 lb (95.7 kg)  11/16/19 213 lb (96.6 kg)  11/07/19 213 lb (96.6 kg)   Home readings:140-150/90-100, even when dizzy.  BP Readings from Last 3 Encounters:  11/23/19 (!) 142/96  11/16/19 (!) 140/96  11/07/19  (!) 154/108   Lab Results  Component Value Date   CREATININE 1.24 10/26/2019  BMP, TSH normal October 26, 2019.   Epistaxis: Daily nosebleeds past 3 days, is able to stop them with toilet paper inside nose. Always on left. Had not had symptoms in years prior. No prior ENT surgery.  Blood pressure has been similar range as above during the nosebleed - about 140-150/90-100.   History Patient Active Problem List   Diagnosis Date Noted  . ADHD (attention deficit hyperactivity disorder) 02/11/2018  . CHRONIC PROSTATITIS 07/08/2008  . DYSURIA, HX OF 07/08/2008   Past Medical History:  Diagnosis Date  . Allergy   . Anxiety   . Depression   . History of vasectomy 04/12/2019   [facility]   Past Surgical History:  Procedure Laterality Date  . VASECTOMY     Allergies  Allergen Reactions  . Codeine Itching and Rash   Prior to Admission medications   Medication Sig Start Date End Date Taking? Authorizing Provider  amLODipine (NORVASC) 10 MG tablet Take 1 tablet (10 mg total) by mouth daily. 11/16/19  Yes Wendie Agreste, MD  FLUoxetine (PROZAC) 20 MG capsule Take 3 capsules (60 mg total) by mouth daily. 10/26/19  Yes Wendie Agreste, MD  ibuprofen (ADVIL,MOTRIN) 200 MG tablet Take 400 mg every 6 (six) hours  as needed by mouth for headache.   Yes [provider]  loratadine (CLARITIN) 10 MG tablet Take 10 mg daily by mouth.   Yes [provider]  MYDAYIS 50 MG CP24 TAKE 1 CAPSULE BY MOUTH ONCE DAILY. FILL IN 60 DAYS 11/13/19  Yes Wendie Agreste, MD   Social History   Socioeconomic History  . Marital status: Married    Spouse name: [name]  . Number of children: 2  . Years of education: Not on file  . Highest education level: Not on file  Occupational History  . Not on file  Tobacco Use  . Smoking status: Former Smoker    Types: Cigarettes  . Smokeless tobacco: Never Used  Substance and Sexual Activity  . Alcohol use: Yes    Comment: once weekly     . Drug use: No  . Sexual activity: Yes  Other Topics Concern  . Not on file  Social History Narrative  . Not on file   Social Determinants of Health   Financial Resource Strain:   . Difficulty of Paying Living Expenses:   Food Insecurity:   . Worried About Charity fundraiser in the Last Year:   . Arboriculturist in the Last Year:   Transportation Needs:   . Film/video editor (Medical):   Marland Kitchen Lack of Transportation (Non-Medical):   Physical Activity:   . Days of Exercise per Week:   . Minutes of Exercise per Session:   Stress:   . Feeling of Stress :   Social Connections:   . Frequency of Communication with Friends and Family:   . Frequency of Social Gatherings with Friends and Family:   . Attends Religious Services:   . Active Member of Clubs or Organizations:   . Attends Archivist Meetings:   Marland Kitchen Marital Status:   Intimate Partner Violence:   . Fear of Current or Ex-Partner:   . Emotionally Abused:   Marland Kitchen Physically Abused:   . Sexually Abused:     Review of Systems Per HPI  Objective:   Vitals:   11/23/19 1045 11/23/19 1047  BP: (!) 151/97 (!) 142/96  Pulse: 85   Temp: 98.1 F (36.7 C)   TempSrc: Temporal   SpO2: 96%   Weight: 211 lb (95.7 kg)   Height: 6\' 2"  (1.88 m)    Wt Readings from Last 3 Encounters:  11/23/19 211 lb (95.7 kg)  11/16/19 213 lb (96.6 kg)  11/07/19 213 lb (96.6 kg)      Physical Exam Vitals reviewed.  Constitutional:      Appearance: He is well-developed.  HENT:     Head: Normocephalic and atraumatic.     Nose:     Comments: Very slight irritation along the septum on the left side, no active bleeding or dried blood.  Minimal crusting.  Sinuses nontender, no lymphadenopathy. Eyes:     Pupils: Pupils are equal, round, and reactive to light.  Neck:     Vascular: No carotid bruit or JVD.  Cardiovascular:     Rate and Rhythm: Normal rate and regular rhythm.     Heart sounds: Normal heart sounds. No murmur.   Pulmonary:     Effort: Pulmonary effort is normal.     Breath sounds: Normal breath sounds. No rales.  Skin:    General: Skin is warm and dry.  Neurological:     Mental Status: He is alert and oriented to person, place, and time.  Psychiatric:  Mood and Affect: Mood normal.        Behavior: Behavior normal.      EKG: Sinus rhythm, rate 75, RSR in V1, no apparent ST or T wave acute changes.  Orthostatic VS for the past 24 hrs (Last 3 readings):  BP- Lying Pulse- Lying BP- Sitting Pulse- Sitting BP- Standing at 0 minutes Pulse- Standing at 0 minutes BP- Standing at 3 minutes Pulse- Standing at 3 minutes  11/23/19 1221 (!) 142/92 97 (!) 140/104 79 134/90 102 (!) 128/104 105    Results for orders placed or performed in visit on 11/23/19  POCT CBC  Result Value Ref Range   WBC 7.3 4.6 - 10.2 K/uL   Lymph, poc 1.1 0.6 - 3.4   POC LYMPH PERCENT 15.4 10 - 50 %L   MID (cbc) 0.6 0 - 0.9   POC MID % 8.9 0 - 12 %M   POC Granulocyte 5.5 2 - 6.9   Granulocyte percent 75.7 37 - 80 %G   RBC 5.35 4.69 - 6.13 M/uL   Hemoglobin 15.2 (A) 11 - 14.6 g/dL   HCT, POC 45.7 (A) 29 - 41 %   MCV 85.3 76 - 111 fL   MCH, POC 28.3 27 - 31.2 pg   MCHC 33.2 31.8 - 35.4 g/dL   RDW, POC 12.9 %   Platelet Count, POC 402 142 - 424 K/uL   MPV 6.6 0 - 99.8 fL    Assessment & Plan:  Alejandro R Ybarra is a 39 y.o. male . Essential hypertension - Plan: Ambulatory referral to Sleep Studies Episode of dizziness - Plan: POCT CBC, Orthostatic vital signs, EKG 12-Lead Fatigue, unspecified type - Plan: POCT CBC, EKG 12-Lead, Ambulatory referral to Sleep Studies Snoring - Plan: Ambulatory referral to Sleep Studies Daytime sleepiness - Plan: Ambulatory referral to Sleep Studies  -  On chart review it looks like he has had some borderline blood pressure for some time, just persistently higher recently.  Previous labs reassuring, less likely secondary hypertension.  -Still slight decreased control, but did not  tolerate 7.5 or 10 mg of amlodipine.  -Initial plan for amlodipine at bedtime to see if that is better tolerated, including option to go up to 7.5 mg if improved tolerance.  If not, would consider change to lisinopril 10 mg, with close monitoring of renal function.   -Recheck 1 week, RTC/ER precautions.  Consider cardiology eval if persistent difficulty with treatment.  Epistaxis  -Few mild episodes recently as above.  Reassuring exam.  Trial of saline nasal spray.  Handout given on epistaxis, and demonstrated technique for lateral pressure for cessation.  RTC precautions.  Consider ENT eval if continued recurrence  No orders of the defined types were placed in this encounter.  Patient Instructions   Saline nasal spray for now to help prevent nosebleed for now. If those continue into next week, I can refer you to ENT to look into other causes.  See other information below. Return to the clinic or go to the nearest emergency room if any of your symptoms worsen or new symptoms occur.  With new development of high blood pressure along with snoring, there is a possibility of obstructive sleep apnea.  I will refer you to sleep specialist to obtain further testing.  I think it is reasonable to switch the amlodipine dosing to bedtime for the next few days.  If you are still having some lightheadedness, dizziness or fatigue episodes during the day, I think we should  change to a different medication.  Let me know how things are going the next few days as well as home readings on change of dosing to bedtime.    If we decide to start a new medication, I would consider an ACE inhibitor like lisinopril, but would want to closely monitor your kidney function to see if other testing needed for high blood pressure or secondary causes of high blood pressure.  Recheck in 1 week.   Return to the clinic or go to the nearest emergency room if any of your symptoms worsen or new symptoms occur.      Nosebleed,  Adult A nosebleed is when blood comes out of the nose. Nosebleeds are common. Usually, they are not a sign of a serious condition. Nosebleeds can happen if a small blood vessel in your nose starts to bleed or if the lining of your nose (mucous membrane) cracks. They are commonly caused by:  Allergies.  Colds.  Picking your nose.  Blowing your nose too hard.  An injury from sticking an object into your nose or getting hit in the nose.  Dry or cold air. Less common causes of nosebleeds include:  Toxic fumes.  Something abnormal in the nose or in the air-filled spaces in the bones of the face (sinuses).  Growths in the nose, such as polyps.  Medicines or conditions that cause blood to clot slowly.  Certain illnesses or procedures that irritate or dry out the nasal passages. Follow these instructions at home: When you have a nosebleed:   Sit down and tilt your head slightly forward.  Use a clean towel or tissue to pinch your nostrils under the bony part of your nose. After 10 minutes, let go of your nose and see if bleeding starts again. Do not release pressure before that time. If there is still bleeding, repeat the pinching and holding for 10 minutes until the bleeding stops.  Do not place tissues or gauze in the nose to stop bleeding.  Avoid lying down and avoid tilting your head backward. That may make blood collect in the throat and cause gagging or coughing.  Use a nasal spray decongestant to help with a nosebleed as told by your health care provider.  Do not use petroleum jelly or mineral oil in your nose. It can drip into your lungs. After a nosebleed:  Avoid blowing your nose or sniffing for a number of hours.  Avoid straining, lifting, or bending at the waist for several days. You may resume other normal activities as you are able.  Use saline spray or a humidifier as told by your health care provider.  Aspirinand blood thinners make bleeding more likely. If  you are prescribed these medicines and you suffer from nosebleeds: ? Ask your health care provider if you should stop taking the medicines or if you should adjust the dose. ? Do not stop taking medicines that your health care provider has recommended unless told by your health care provider.  If your nosebleed was caused by dry mucous membranes, use over-the-counter saline nasal spray or gel. This will keep the mucous membranes moist and allow them to heal. If you must use a lubricant: ? Choose one that is water-soluble. ? Use only as much as you need and use it only as often as needed. ? Do not lie down until several hours after you use it. Contact a health care provider if:  You have a fever.  You get nosebleeds often or more  often than usual.  You bruise very easily.  You have a nosebleed from having something stuck in your nose.  You have bleeding in your mouth.  You vomit or cough up brown material.  You have a nosebleed after you start a new medicine. Get help right away if:  You have a nosebleed after a fall or a head injury.  Your nosebleed does not go away after 20 minutes.  You feel dizzy or weak.  You have unusual bleeding from other parts of your body.  You have unusual bruising on other parts of your body.  You become sweaty.  You vomit blood. This information is not intended to replace advice given to you by your health care provider. Make sure you discuss any questions you have with your health care provider. Document Revised: 11/29/2017 Document Reviewed: 03/16/2016 Elsevier Patient Education  [employer].    If you have lab work done today you will be contacted with your lab results within the next 2 weeks.  If you have not heard from [place] then please contact us. The fastest way to get your results is to register for My Chart.   IF you received an x-ray today, you will receive an invoice from [facility] Radiology. Please contact [facility]  Radiology at [phone] with questions or concerns regarding your invoice.   IF you received labwork today, you will receive an invoice from [place]. Please contact [facility] at [phone] with questions or concerns regarding your invoice.   Our billing staff will not be able to assist you with questions regarding bills from these companies.  You will be contacted with the lab results as soon as they are available. The fastest way to get your results is to activate your My Chart account. Instructions are located on the last page of this paperwork. If you have not heard from [place] regarding the results in 2 weeks, please contact this office.         Signed, [name], MD [facility]

## 2019-11-24 ENCOUNTER — Encounter: Payer: Self-pay | Admitting: Family Medicine

## 2019-12-03 ENCOUNTER — Other Ambulatory Visit: Payer: Self-pay

## 2019-12-03 ENCOUNTER — Encounter: Payer: Self-pay | Admitting: Family Medicine

## 2019-12-03 ENCOUNTER — Ambulatory Visit (INDEPENDENT_AMBULATORY_CARE_PROVIDER_SITE_OTHER): Payer: No Typology Code available for payment source | Admitting: Family Medicine

## 2019-12-03 VITALS — BP 140/100 | HR 107 | Temp 98.0°F | Ht 74.0 in | Wt 211.0 lb

## 2019-12-03 DIAGNOSIS — R5383 Other fatigue: Secondary | ICD-10-CM

## 2019-12-03 DIAGNOSIS — R0683 Snoring: Secondary | ICD-10-CM

## 2019-12-03 DIAGNOSIS — I10 Essential (primary) hypertension: Secondary | ICD-10-CM

## 2019-12-03 NOTE — Patient Instructions (Addendum)
I am glad to hear the nighttime dosing works better.  Try 7.5 mg amlodipine and let me know by MyChart how that is going later this week.  Follow-up with sleep specialist as planned.  I will also refer you to cardiology given the family history of heart issues and the fatigue/blood pressure.  Follow-up with me in 1 month depending on how blood pressure does on this new dose.  Let me know if there are questions sooner and take care.   If you have lab work done today you will be contacted with your lab results within the next 2 weeks.  If you have not heard from [place] then please contact us. The fastest way to get your results is to register for My Chart.   IF you received an x-ray today, you will receive an invoice from [facility] Rad[facility]. Please contact [facility] Radiology at [phone] with questions or concerns regarding your invoice.   IF you received labwork today, you will receive an invoice from [place]. Please contact [facility] at [phone] with questions or concerns regarding your invoice.   Our billing staff will not be able to assist you with questions regarding bills from these companies.  You will be contacted with the lab results as soon as they are available. The fastest way to get your results is to activate your My Chart account. Instructions are located on the last page of this paperwork. If you have not heard from [place] regarding the results in 2 weeks, please contact this office.

## 2019-12-03 NOTE — Progress Notes (Signed)
Subjective:  Patient ID: Alejandro Johnson, male    DOB: [birth year]  Age: 39 y.o. MRN: [id number]  CC:  Chief Complaint  Patient presents with  . Follow-up    on hypertension. pt states his BP has been on around 145/102 most of the time when he checks it at home. pt checks his BP once a day. pt gets a headache right behinf his forhead about 3x a week.    HPI Alejandro R Greenough presents for  Hypertension: Follow-up from March 12.  See previous notes.  Tried increasing to 10 mg of amlodipine but did not tolerate with feeling shaky, dizzy, fatigue.  Similar symptoms at 7.5 mg.  Taking 5 mg amlodipine at last visit.  Had remained on Mydayis 50 mg daily.  On chart review last visit it did appear he had some borderline elevated blood pressure/mild elevated blood pressures in the past, just higher recently.  I did recommend initial trial of amlodipine at bedtime to see if better tolerated, with option of 7.5 mg for improved control.  Better with side effects taking meds at bedtime.  No further dizziness/loopy feeling. Still some fatigue with physical activity - not sure how long this has been present. Less active with pandemic.  No chest pain.  Less snoring at night - family noted loud snoring prior. Has referral to sleep specialist - will need to reschedule.  Episodic headache, not persistent. No further nosebleeds.  No recent missed doses.  FH: mom - HTN, paternal uncle, paternal GF, maternal GF, maternal aunt with MI in 74's. All smokers.   Home readings: 140's/90's.  BP Readings from Last 3 Encounters:  12/03/19 (!) 140/100  11/23/19 (!) 142/96  11/16/19 (!) 140/96   Lab Results  Component Value Date   CREATININE 1.24 10/26/2019    History Patient Active Problem List   Diagnosis Date Noted  . ADHD (attention deficit hyperactivity disorder) 02/11/2018  . CHRONIC PROSTATITIS 07/08/2008  . DYSURIA, HX OF 07/08/2008   Past Medical History:  Diagnosis Date  . Allergy   . Anxiety   .  Depression   . History of vasectomy 04/12/2019   [facility]   Past Surgical History:  Procedure Laterality Date  . VASECTOMY     Allergies  Allergen Reactions  . Codeine Itching and Rash   Prior to Admission medications   Medication Sig Start Date End Date Taking? Authorizing Provider  amLODipine (NORVASC) 10 MG tablet Take 1 tablet (10 mg total) by mouth daily. 11/16/19  Yes Wendie Agreste, MD  FLUoxetine (PROZAC) 20 MG capsule Take 3 capsules (60 mg total) by mouth daily. 10/26/19  Yes Wendie Agreste, MD  ibuprofen (ADVIL,MOTRIN) 200 MG tablet Take 400 mg every 6 (six) hours as needed by mouth for headache.   Yes [provider]  loratadine (CLARITIN) 10 MG tablet Take 10 mg daily by mouth.   Yes [provider]  MYDAYIS 50 MG CP24 TAKE 1 CAPSULE BY MOUTH ONCE DAILY. FILL IN 60 DAYS 11/13/19  Yes Wendie Agreste, MD   Social History   Socioeconomic History  . Marital status: Married    Spouse name: [name]  . Number of children: 2  . Years of education: Not on file  . Highest education level: Not on file  Occupational History  . Not on file  Tobacco Use  . Smoking status: Former Smoker    Types: Cigarettes  . Smokeless tobacco: Never Used  Substance and Sexual Activity  .  Alcohol use: Yes    Comment: once weekly   . Drug use: No  . Sexual activity: Yes  Other Topics Concern  . Not on file  Social History Narrative  . Not on file   Social Determinants of Health   Financial Resource Strain:   . Difficulty of Paying Living Expenses:   Food Insecurity:   . Worried About Charity fundraiser in the Last Year:   . Arboriculturist in the Last Year:   Transportation Needs:   . Film/video editor (Medical):   Marland Kitchen Lack of Transportation (Non-Medical):   Physical Activity:   . Days of Exercise per Week:   . Minutes of Exercise per Session:   Stress:   . Feeling of Stress :   Social Connections:   . Frequency of Communication with Friends  and Family:   . Frequency of Social Gatherings with Friends and Family:   . Attends Religious Services:   . Active Member of Clubs or Organizations:   . Attends Archivist Meetings:   Marland Kitchen Marital Status:   Intimate Partner Violence:   . Fear of Current or Ex-Partner:   . Emotionally Abused:   Marland Kitchen Physically Abused:   . Sexually Abused:     Review of Systems Per HPI  Objective:   Vitals:   12/03/19 1430 12/03/19 1433  BP: (!) 145/102 (!) 140/100  Pulse: (!) 107   Temp: 98 F (36.7 C)   TempSrc: Temporal   SpO2: 97%   Weight: 211 lb (95.7 kg)   Height: 6\' 2"  (1.88 m)      Physical Exam Vitals reviewed.  Constitutional:      Appearance: He is well-developed.  HENT:     Head: Normocephalic and atraumatic.  Eyes:     Pupils: Pupils are equal, round, and reactive to light.  Neck:     Vascular: No carotid bruit or JVD.  Cardiovascular:     Rate and Rhythm: Normal rate and regular rhythm.     Heart sounds: Normal heart sounds. No murmur.  Pulmonary:     Effort: Pulmonary effort is normal.     Breath sounds: Normal breath sounds. No rales.  Skin:    General: Skin is warm and dry.  Neurological:     Mental Status: He is alert and oriented to person, place, and time.        Assessment & Plan:  Alejandro R Wisser is a 39 y.o. male . Essential hypertension - Plan: Ambulatory referral to Cardiology  Fatigue, unspecified type - Plan: Ambulatory referral to Cardiology  Snoring  -Still elevated blood pressure, will try 7.5 mg of amlodipine as nighttime dosing has been better tolerated.  Plan on update by MyChart on new dosing.  Still suspect component of possible obstructive sleep apnea and plans on rescheduling sleep study.  Will refer to cardiology given family history of cardiac disease,, as well as fatigue with certain activities.  Denies chest pain.  Could certainly be component of deconditioning with decreased activity past year.  ER/RTC precautions given. Recheck  1 month.   No orders of the defined types were placed in this encounter.  Patient Instructions   I am glad to hear the nighttime dosing works better.  Try 7.5 mg amlodipine and let me know by MyChart how that is going later this week.  Follow-up with sleep specialist as planned.  I will also refer you to cardiology given the family history of heart issues and  the fatigue/blood pressure.  Follow-up with me in 1 month depending on how blood pressure does on this new dose.  Let me know if there are questions sooner and take care.   If you have lab work done today you will be contacted with your lab results within the next 2 weeks.  If you have not heard from [place] then please contact us. The fastest way to get your results is to register for My Chart.   IF you received an x-ray today, you will receive an invoice from [facility]. Please contact [facility] at [phone] with questions or concerns regarding your invoice.   IF you received labwork today, you will receive an invoice from [facility]. Please contact [facility] at [phone] with questions or concerns regarding your invoice.   Our billing staff will not be able to assist you with questions regarding bills from these companies.  You will be contacted with the lab results as soon as they are available. The fastest way to get your results is to activate your My Chart account. Instructions are located on the last page of this paperwork. If you have not heard from [place] regarding the results in 2 weeks, please contact this office.         Signed, [name], MD [facility]

## 2019-12-04 ENCOUNTER — Institutional Professional Consult (permissible substitution): Payer: No Typology Code available for payment source | Admitting: Neurology

## 2019-12-07 MED FILL — AMPHETAMINE-DEXTROAMPHETAMI: 10 | 30 days supply | Qty: 30 | Fill #0

## 2019-12-09 ENCOUNTER — Encounter: Payer: Self-pay | Admitting: Family Medicine

## 2019-12-11 MED FILL — MYDAYIS ER 50 MG CAPSULE: 50 | 30 days supply | Qty: 30 | Fill #0

## 2019-12-11 NOTE — Telephone Encounter (Signed)
Alejandro Johnson has been taking 7.5 mg of amlodipine at night states for the most part he feels better taking it at this time, but still is getting weak and shaky during some activities, and his feet are swollen. Bp average 140/90.  Do you recommend any changes or follow up?

## 2019-12-13 ENCOUNTER — Encounter: Payer: Self-pay | Admitting: General Practice

## 2019-12-18 ENCOUNTER — Encounter: Payer: Self-pay | Admitting: Family Medicine

## 2019-12-18 ENCOUNTER — Encounter: Payer: Self-pay | Admitting: Neurology

## 2019-12-18 ENCOUNTER — Ambulatory Visit: Payer: No Typology Code available for payment source | Admitting: Neurology

## 2019-12-18 ENCOUNTER — Other Ambulatory Visit: Payer: Self-pay

## 2019-12-18 VITALS — BP 141/95 | HR 101 | Temp 97.3°F | Ht 74.0 in | Wt 210.0 lb

## 2019-12-18 DIAGNOSIS — R040 Epistaxis: Secondary | ICD-10-CM | POA: Diagnosis not present

## 2019-12-18 DIAGNOSIS — I10 Essential (primary) hypertension: Secondary | ICD-10-CM | POA: Diagnosis not present

## 2019-12-18 DIAGNOSIS — G253 Myoclonus: Secondary | ICD-10-CM

## 2019-12-18 DIAGNOSIS — F908 Attention-deficit hyperactivity disorder, other type: Secondary | ICD-10-CM | POA: Diagnosis not present

## 2019-12-18 NOTE — Progress Notes (Signed)
SLEEP MEDICINE CLINIC    Provider:  Larey Seat, MD  Primary Care Physician:  Wendie Agreste, MD [address] [place] [id number]     Referring Provider: Wendie Agreste, [address] [place],  [place] [id number]          Chief Complaint according to patient   Patient presents with:    . New Patient (Initial Visit)           HISTORY OF PRESENT ILLNESS:  Alejandro Johnson is a 39 y.o. year old  Caucasian male patient seen here upon  a referral by Dr Carlota Raspberry on 12/18/2019   Chief concern according to patient : " I have high blood pressures, up to 180 mmHg, had severe dizziness and headaches. Alejandro Johnson made me go to the hospital and we don't know the cause if it. I snore and Dr. Carlota Raspberry wants to rule out sleep apnea as a co-factor".    I have the pleasure of seeing Alejandro Johnson today, a right -handed White or Caucasian male with a possible sleep disorder.  He  has a past medical history of Allergy, Anxiety, Depression, and History of vasectomy (04/12/2019). 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 ADHD.    Family medical /sleep history: No other family member on CPAP with OSA, insomnia, .    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 Pets are  present. Tobacco use until 2007.  ETOH use: none anymore,  Caffeine intake in form of Coffee( none ) Soda( yes) Tea ( yes) or energy drinks. protein bar eater.  Regular exercise : none during Pandemic.  Low endurance and stamina.      Sleep habits are as follows: The patient's dinner time is between 5-6 PM. The patient goes to bed at 9-10 PM and continues to sleep for many hours, wakes rarely for  bathroom breaks.   The preferred sleep position is side -ways, with the support of 1 pillows. Dreams are reportedly rare. 5-7 AM is the usual rise time. The patient wakes up when the baby wakes up.   He reports not feeling refreshed or restored in AM, with symptoms such as dry mouth.  Naps are taken infrequently, lasting from 15-30 minutes. He has twitching movements.    Review of Systems: Out of a complete 14 system review, the patient complains of only the following symptoms, and all other reviewed systems are negative.:  Fatigue, , dry mouth,  snoring, fragmented sleep, Insomnia   How likely are you to doze in the following situations: 0 = not likely, 1 = slight chance, 2 = moderate chance, 3 = high chance   Sitting and Reading? Watching Television? Sitting inactive in a public place (theater or meeting)? As a passenger in a car for an hour without a break? Lying down in the afternoon when circumstances permit? Sitting and talking to someone? Sitting quietly after lunch without alcohol? In a car, while stopped for a few minutes in traffic?   Total = 0 / 24 points   FSS endorsed at 37/ 63 points.   Social History   Socioeconomic History  . Marital status: Married    Spouse name: Alejandro Johnson  . Number of children: 2  . Years of education: Not on file  . Highest education level: Not on file  Occupational History  . Not on file  Tobacco Use  . Smoking status:  Former Smoker    Types: Cigarettes  . Smokeless tobacco: Never Used  Substance and Sexual Activity  . Alcohol use: Yes    Comment: once weekly   . Drug use: No  . Sexual activity: Yes  Other Topics Concern  . Not on file  Social History Narrative  . Not on file   Social Determinants of Health   Financial Resource Strain:   . Difficulty of Paying Living Expenses:   Food Insecurity:   . Worried About Charity fundraiser in the Last Year:   . Arboriculturist in the Last Year:   Transportation Needs:   . Film/video editor (Medical):   Marland Kitchen Lack of Transportation (Non-Medical):   Physical Activity:   . Days of Exercise per Week:   . Minutes of Exercise per Session:   Stress:   . Feeling of Stress :   Social Connections:   . Frequency of Communication with Friends and Family:   . Frequency of  Social Gatherings with Friends and Family:   . Attends Religious Services:   . Active Member of Clubs or Organizations:   . Attends Archivist Meetings:   Marland Kitchen Marital Status:     Family History  Problem Relation Age of Onset  . Mental illness Mother   . Arthritis Father   . Mental illness Maternal Grandmother     Past Medical History:  Diagnosis Date  . Allergy   . Anxiety   . Depression   . History of vasectomy 04/12/2019   [facility]    Past Surgical History:  Procedure Laterality Date  . VASECTOMY       Current Outpatient Medications on File Prior to Visit  Medication Sig Dispense Refill  . amLODipine (NORVASC) 10 MG tablet Take 1 tablet (10 mg total) by mouth daily. 90 tablet 1  . FLUoxetine (PROZAC) 20 MG capsule Take 3 capsules (60 mg total) by mouth daily. 270 capsule 1  . ibuprofen (ADVIL,MOTRIN) 200 MG tablet Take 400 mg every 6 (six) hours as needed by mouth for headache.    . loratadine (CLARITIN) 10 MG tablet Take 10 mg daily by mouth.    Marland Kitchen MYDAYIS 50 MG CP24 TAKE 1 CAPSULE BY MOUTH ONCE DAILY. FILL IN 60 DAYS 30 capsule 0   No current facility-administered medications on file prior to visit.    Allergies  Allergen Reactions  . Codeine Itching and Rash    Physical exam:  Today's Vitals   12/18/19 1255  BP: (!) 141/95  Pulse: (!) 101  Temp: (!) 97.3 F (36.3 C)  Weight: 210 lb (95.3 kg)  Height: 6\' 2"  (1.88 m)   Body mass index is 26.96 kg/m.   Wt Readings from Last 3 Encounters:  12/18/19 210 lb (95.3 kg)  12/03/19 211 lb (95.7 kg)  11/23/19 211 lb (95.7 kg)     Ht Readings from Last 3 Encounters:  12/18/19 6\' 2"  (1.88 m)  12/03/19 6\' 2"  (1.88 m)  11/23/19 6\' 2"  (1.88 m)      General: The patient is awake, alert and appears not in acute distress. The patient is well groomed. Head: Normocephalic, atraumatic. Neck is supple. Mallampati 2  neck circumference:17 inches . Nasal airflow patent.  Retrognathia is seen.  Dental  status: intact  Cardiovascular:  Regular rate and cardiac rhythm by pulse,  without distended neck veins. Respiratory: Lungs are clear to auscultation.  Skin:  Without evidence of ankle edema, or rash. Trunk: The  patient's posture is erect.   Neurologic exam : The patient is awake and alert, oriented to place and time.   Memory subjective described as intact.  Attention span & concentration ability appears normal.  Speech is fluent,  without  dysarthria, dysphonia or aphasia.  Mood and affect are appropriate.   Cranial nerves: no loss of smell or taste reported  Pupils are equal and briskly reactive to light. Funduscopic exam deferred.  Extraocular movements in vertical and horizontal planes were intact and without nystagmus. No Diplopia. Visual fields by finger perimetry are intact. Hearing was intact to soft voice and finger rubbing.    Facial sensation intact to fine touch.  Facial motor strength is symmetric and tongue and uvula move midline.  Neck ROM : rotation, tilt and flexion extension were normal for age and shoulder shrug was symmetrical.    Motor exam:  Symmetric bulk, tone and ROM.   Normal tone without cog wheeling, symmetric grip strength . Sensory:  Fine touch, pinprick and vibration were tested  and  normal.  Proprioception tested in the upper extremities was normal. Coordination: Rapid alternating movements in the fingers/hands were of normal speed.  The Finger-to-nose maneuver was intact without evidence of ataxia, dysmetria or tremor Gait and station: Patient could rise unassisted from a seated position, walked without assistive device.  Stance is of normal width/ base and the patient turned with 3 steps.  Toe and heel walk were deferred.  Deep tendon reflexes: in the  upper and lower extremities are symmetric and intact.  Babinski response was deferred.      After spending a total time of  35 minutes face to face and additional time for physical and neurologic  examination, review of laboratory studies,  personal review of imaging studies, reports and results of other testing and review of referral information / records as far as provided in visit, I have established the following assessments:  Alejandro Johnson are down is a 39 year old Caucasian right-handed gentleman with a history of ADHD, he has very minimal caffeine intake he does not drink alcohol and he does no longer smoke quit 14 years ago.  He developed a problem with shakiness with headaches lightheadedness and dizziness when he was diagnosed with a rather sudden elevation of blood pressure.  His wife Alejandro Johnson made him go to the emergency room and shortly after he was prescribed blood pressure medication but developed nosebleeds and slight orthostatic dizziness.  He now seems to be at a good level of medication and his blood pressure has been controlled but he had definitely peaks but were in a dangerous range.  He had started after the ER visit from February 24 on amlodipine-Norvasc 2.5 mg then this was elevated to 5 mg from there on he had to reduce it back to 2.5.  The nosebleeds have ceased and have not there have been none in March.  His blood pressure has been in the 140s and below for most of the time now.  My goal is to investigate if he does have underlying sleep apnea.  I think that a home sleep test will be enough to monitor for that condition.  However he also describes some twitchiness restlessness myoclonic jerking which may be promoted by ADHD medication, if we want to investigate that any further an attended sleep study will actually be better.  He does not have restless legs.  No history of neuropathy.     My Plan is to proceed with:  1) HST for  OSA/ hypertension causes / PLM or myoclunus/ related to ADHD medication.    I would like to thank Wendie Agreste, MD and Wendie Agreste, [address] [place],  [place] [id number] for allowing me to meet with and to take care of this pleasant  patient.   In short, Alejandro Johnson is presenting with HTN new onset , a symptom that can be attributed to OSA and stimulant medications I plan to follow up either personally or through our NP within 2-3 month.     Electronically signed by: Larey Seat, MD 12/18/2019 1:08 PM  [facility] certified by The [employer] of Sleep Medicine and Diplomate of the [employer] of Sleep Medicine. Board certified In Neurology through the [place], Fellow of the [employer] of Neurology. Medical Director of [employer].

## 2019-12-18 NOTE — Patient Instructions (Signed)

## 2019-12-28 ENCOUNTER — Ambulatory Visit: Payer: No Typology Code available for payment source | Admitting: Family Medicine

## 2020-01-01 ENCOUNTER — Encounter: Payer: Self-pay | Admitting: Family Medicine

## 2020-01-01 NOTE — Telephone Encounter (Signed)
Pt forgot to take BP meds last night, he should be okay to just continue dose as normal today correct?  Pt is worried about serotonin syndrome with his combination of meds, is this something he should be worried about at this time?

## 2020-01-03 NOTE — Progress Notes (Addendum)
Cardiology Office Note   Date:  01/04/2020   ID:  Alejandro Johnson, DOB 11-28-1980, MRN [id number]  PCP:  Wendie Agreste, MD  Cardiologist:   No primary care provider on file. PCP:  Wendie Agreste, MD  Chief Complaint  Patient presents with  . Hypertension      History of Present Illness: Alejandro Johnson is a 39 y.o. male who is referred by Wendie Agreste, MD for evaluation of HTN.    He was in the ED for this in Feb.  I reviewed these records for this visit.  BP was 190/112.    As I look back through his readings it interesting that he suddenly developed hypertension in February of this year.  He presented to the emergency room because he was having headache.  He did not require treatment was started on amlodipine.  By his description there have been multiple med changes and he is actually taking 7 and half milligrams of Norvasc now at night.  He does not like it because he feels a little sluggish and has some lower extremity swelling with this.  His blood pressure seems to be a little bit better controlled.  He is also decreased his caffeine and his sodium intake.  He does take stimulants for ADHD but he has been on these for a long time and has not changed.  He had his Prozac reduced recently.  He denies any chest pressure, neck or arm discomfort.  He said no new shortness of breath, PND or orthopnea.  He said no palpitations, presyncope or syncope.  He does not exercise much as he used to because he has a 70-1/2-year-old.  He has not had any flushing.  Past Medical History:  Diagnosis Date  . ADHD   . Allergy   . Anxiety   . Depression   . History of vasectomy 04/12/2019   [facility]    Past Surgical History:  Procedure Laterality Date  . VASECTOMY       Current Outpatient Medications  Medication Sig Dispense Refill  . amLODipine (NORVASC) 5 MG tablet Take 1 tablet (5 mg total) by mouth daily. 90 tablet 3  . chlorthalidone (HYGROTON) 25 MG tablet Take 1 tablet (25  mg total) by mouth daily. 90 tablet 3  . FLUoxetine (PROZAC) 40 MG capsule Take 1 capsule (40 mg total) by mouth daily. 90 capsule 1  . ibuprofen (ADVIL,MOTRIN) 200 MG tablet Take 400 mg every 6 (six) hours as needed by mouth for headache.    . loratadine (CLARITIN) 10 MG tablet Take 10 mg daily by mouth.    Marland Kitchen MYDAYIS 50 MG CP24 TAKE 1 CAPSULE BY MOUTH ONCE DAILY. FILL IN 60 DAYS 30 capsule 0   No current facility-administered medications for this visit.    Allergies:   Codeine    Social History:  The patient  reports that he has quit smoking. His smoking use included cigarettes. He has never used smokeless tobacco. He reports current alcohol use. He reports that he does not use drugs.   Family History:  The patient's family history includes Arthritis in his father; Hypertension in his mother; Mental illness in his maternal grandmother and mother.    ROS:  Please see the history of present illness.   Otherwise, review of systems are positive for none.   All other systems are reviewed and negative.    PHYSICAL EXAM: VS:  BP (!) 138/96 (BP Location: Right Arm, Patient Position:  Sitting, Cuff Size: Normal)   Pulse 91   Temp 98.2 F (36.8 C)   Ht 6\' 2"  (1.88 m)   Wt 216 lb (98 kg)   BMI 27.73 kg/m  , BMI Body mass index is 27.73 kg/m. GENERAL:  Well appearing HEENT:  Pupils equal round and reactive, fundi not visualized, oral mucosa unremarkable NECK:  No jugular venous distention, waveform within normal limits, carotid upstroke brisk and symmetric, no bruits, no thyromegaly LYMPHATICS:  No cervical, inguinal adenopathy LUNGS:  Clear to auscultation bilaterally BACK:  No CVA tenderness CHEST:  Unremarkable HEART:  PMI not displaced or sustained,S1 and S2 within normal limits, no S3, no S4, no clicks, no rubs, NO murmurs ABD:  Flat, positive bowel sounds normal in frequency in pitch, no bruits, no rebound, no guarding, no midline pulsatile mass, no hepatomegaly, no splenomegaly EXT:   2 plus pulses throughout, no edema, no cyanosis no clubbing SKIN:  No rashes no nodules NEURO:  Cranial nerves II through XII grossly intact, motor grossly intact throughout PSYCH:  Cognitively intact, oriented to person place and time  Thank you  EKG:  EKG is ordered today. The ekg ordered today demonstrates sinus rhythm, rate 91, axis within normal intervals within normal limits, no acute ST-T wave changes.   Recent Labs: 10/26/2019: BUN 11; Creatinine, Ser 1.24; Potassium 4.8; Sodium 139; TSH 1.020 11/23/2019: Hemoglobin 15.2    Lipid Panel    Component Value Date/Time   CHOL 213 (H) 02/17/2018 1620   TRIG 236 (H) 02/17/2018 1620   HDL 51 02/17/2018 1620   CHOLHDL 4.2 02/17/2018 1620   LDLCALC 115 (H) 02/17/2018 1620      Wt Readings from Last 3 Encounters:  01/04/20 216 lb (98 kg)  01/04/20 214 lb 9.6 oz (97.3 kg)  12/18/19 210 lb (95.3 kg)      Other studies Reviewed: Additional studies/ records that were reviewed today include: ED records. Review of the above records demonstrates:  Please see elsewhere in the note.     ASSESSMENT AND PLAN:  HTN: I do not see a secondary etiology.  I do think this very well could be related to his ADHD medications but he does need this.  He wants to see if we can try something different than the Norvasc.  I am going to go down to 5 mg and add chlorthalidone 25 mg every morning.  He can get a basic metabolic profile in a couple of weeks.  He understands we might need more adjustments.  Although I do not strongly suspect another secondary etiology I will check a renal ultrasound given the sudden disappearance of his hypertension.  COVID EDUCATION:  We talked about the vaccine. He has not had this.   Current medicines are reviewed at length with the patient today.  The patient does not have concerns regarding medicines.  The following changes have been made:  As above  Labs/ tests ordered today include:   Orders Placed This  Encounter  Procedures  . Basic metabolic panel  . EKG 12-Lead  . VAS US RENAL ARTERY DUPLEX     Disposition:   FU with APP in one month.     Signed, Minus Breeding, MD  01/04/2020 5:33 PM    [place]

## 2020-01-04 ENCOUNTER — Encounter: Payer: Self-pay | Admitting: Family Medicine

## 2020-01-04 ENCOUNTER — Encounter: Payer: Self-pay | Admitting: Cardiology

## 2020-01-04 ENCOUNTER — Other Ambulatory Visit: Payer: Self-pay

## 2020-01-04 ENCOUNTER — Ambulatory Visit (INDEPENDENT_AMBULATORY_CARE_PROVIDER_SITE_OTHER): Payer: No Typology Code available for payment source | Admitting: Cardiology

## 2020-01-04 ENCOUNTER — Ambulatory Visit (INDEPENDENT_AMBULATORY_CARE_PROVIDER_SITE_OTHER): Payer: No Typology Code available for payment source | Admitting: Family Medicine

## 2020-01-04 VITALS — BP 127/97 | HR 75 | Temp 98.0°F | Resp 15 | Ht 74.0 in | Wt 214.6 lb

## 2020-01-04 VITALS — BP 138/96 | HR 91 | Temp 98.2°F | Ht 74.0 in | Wt 216.0 lb

## 2020-01-04 DIAGNOSIS — I701 Atherosclerosis of renal artery: Secondary | ICD-10-CM

## 2020-01-04 DIAGNOSIS — R42 Dizziness and giddiness: Secondary | ICD-10-CM | POA: Diagnosis not present

## 2020-01-04 DIAGNOSIS — I160 Hypertensive urgency: Secondary | ICD-10-CM | POA: Diagnosis not present

## 2020-01-04 DIAGNOSIS — F329 Major depressive disorder, single episode, unspecified: Secondary | ICD-10-CM

## 2020-01-04 DIAGNOSIS — Z7689 Persons encountering health services in other specified circumstances: Secondary | ICD-10-CM | POA: Insufficient documentation

## 2020-01-04 DIAGNOSIS — Z79899 Other long term (current) drug therapy: Secondary | ICD-10-CM | POA: Diagnosis not present

## 2020-01-04 DIAGNOSIS — I10 Essential (primary) hypertension: Secondary | ICD-10-CM | POA: Diagnosis not present

## 2020-01-04 DIAGNOSIS — F988 Other specified behavioral and emotional disorders with onset usually occurring in childhood and adolescence: Secondary | ICD-10-CM

## 2020-01-04 DIAGNOSIS — Z0001 Encounter for general adult medical examination with abnormal findings: Secondary | ICD-10-CM | POA: Insufficient documentation

## 2020-01-04 DIAGNOSIS — F32A Depression, unspecified: Secondary | ICD-10-CM

## 2020-01-04 MED ORDER — FLUOXETINE HCL 40 MG PO CAPS: 40.0000 mg | ORAL_CAPSULE | Freq: Every day | ORAL | 1 refills | Status: DC

## 2020-01-04 MED ORDER — CHLORTHALIDONE 25 MG PO TABS: 25.0000 mg | ORAL_TABLET | Freq: Every day | ORAL | 3 refills | Status: DC

## 2020-01-04 MED ORDER — AMLODIPINE BESYLATE 5 MG PO TABS: 5.0000 mg | ORAL_TABLET | Freq: Every day | ORAL | 3 refills | Status: DC

## 2020-01-04 NOTE — Patient Instructions (Addendum)
  Can try decreasing prozac to 40mg  as combination with mydayis could affect serotonin and may elevate levels of Mydayis. Let me know if that change makes any difference in the occasional spells. No other medication changes for now, I will keep an eye out for results from cardiology appointment as well as sleep study.   If you have lab work done today you will be contacted with your lab results within the next 2 weeks.  If you have not heard from [place] then please contact us. The fastest way to get your results is to register for My Chart.   IF you received an x-ray today, you will receive an invoice from [facility]. Please contact [facility] at [phone] with questions or concerns regarding your invoice.   IF you received labwork today, you will receive an invoice from [place]. Please contact [facility] at [phone] with questions or concerns regarding your invoice.   Our billing staff will not be able to assist you with questions regarding bills from these companies.  You will be contacted with the lab results as soon as they are available. The fastest way to get your results is to activate your My Chart account. Instructions are located on the last page of this paperwork. If you have not heard from [place] regarding the results in 2 weeks, please contact this office.

## 2020-01-04 NOTE — Patient Instructions (Signed)
Medication Instructions:  START CHLORTHALIDONE 25MG  DAILY  DECREASE NORVASC TO 5MG  DAILY *If you need a refill on your cardiac medications before your next appointment, please call your pharmacy*  Lab Work: Your physician recommends that you return for lab work in [place] (BMP) If you have labs (blood work) drawn today and your tests are completely normal, you will receive your results only by: Marland Kitchen MyChart Message (if you have MyChart) OR . A paper copy in the mail If you have any lab test that is abnormal or we need to change your treatment, we will call you to review the results.  Testing/Procedures: Your physician has requested that you have a renal artery duplex. During this test, an ultrasound is used to evaluate blood flow to the kidneys. Allow one hour for this exam. Do not eat after midnight the day before and avoid carbonated beverages. Take your medications as you usually do.  Follow-Up: At [facility], you and your health needs are our priority.  As part of our continuing mission to provide you with exceptional heart care, we have created designated Provider Care Teams.  These Care Teams include your primary Cardiologist (physician) and Advanced Practice Providers (APPs -  Physician Assistants and Nurse Practitioners) who all work together to provide you with the care you need, when you need it.  Your next appointment:   1 month(s)  The format for your next appointment:   In Person  Provider:   Almyra Deforest, PA-C

## 2020-01-04 NOTE — Progress Notes (Signed)
Subjective:  Patient ID: Alejandro Johnson, male    DOB: 1981/06/08  Age: 39 y.o. MRN: [id number]  CC:  Chief Complaint  Patient presents with  . Hypertension    pt has been checking BP avaerage 140-150 over 90s, pt states he has been getting a little light headed or dizzy, denies headaches, pt notes he has some ankle swelling has disipated some     HPI Alejandro Johnson presents for   Hypertension: See prior visits, variable control blood pressure but also has been associated with dizziness/lightheadedness at times with small medication adjustments. Was sent to sleep specialist, evaluated April 6, home sleep test planned next week.   Some swelling was noted on my chart message.  Option of decreasing amlodipine from 7.5 mg to 5 mg, as also reporting some weaker shaky symptoms. I have referred him to cardiology previously, it appears they were trying to get in touch with him for appointment, and is scheduled to meet with Dr. Percival Spanish this afternoon.  Last creatinine normal at 1.24 in February.  Stayed at 7.5mg .  Only minimal ankle swelling, not much anymore. Thinks may have been from overuse of amlodipine - was taking total of 15mg  per day for about 2 weeks. Less foot swelling on 7.5mg  since 12/18/19.  Still episodes of shakiness/dizziness - about 102 times per day. 10-62min. No palpitations. "spell".  Anxiety feels controlled on Prozac at 60mg . Also on Mydayis. Would conside lower dose prozac. Depression controlled.  Depression screen [facility] 2/9 01/04/2020 12/03/2019 11/23/2019 11/16/2019 10/26/2019  Decreased Interest 0 0 0 0 0  Down, Depressed, Hopeless 0 0 0 0 0  PHQ - 2 Score 0 0 0 0 0  Altered sleeping - - - - 1  Tired, decreased energy - - - - 0  Change in appetite - - - - 0  Feeling bad or failure about yourself  - - - - 0  Trouble concentrating - - - - 0  Moving slowly or fidgety/restless - - - - 0  Suicidal thoughts - - - - 0  PHQ-9 Score - - - - 1  Difficult doing work/chores - - - - -    Home readings:140/90 range, occasionally 130/80   BP Readings from Last 3 Encounters:  01/04/20 (!) 127/97  12/18/19 (!) 141/95  12/03/19 (!) 140/100   Lab Results  Component Value Date   CREATININE 1.24 10/26/2019     History Patient Active Problem List   Diagnosis Date Noted  . Hypertension 12/18/2019  . Epistaxis 12/18/2019  . ADHD (attention deficit hyperactivity disorder) 02/11/2018  . CHRONIC PROSTATITIS 07/08/2008  . DYSURIA, HX OF 07/08/2008   Past Medical History:  Diagnosis Date  . Allergy   . Anxiety   . Depression   . History of vasectomy 04/12/2019   [facility]   Past Surgical History:  Procedure Laterality Date  . VASECTOMY     Allergies  Allergen Reactions  . Codeine Itching and Rash   Prior to Admission medications   Medication Sig Start Date End Date Taking? Authorizing Provider  amLODipine (NORVASC) 10 MG tablet Take 1 tablet (10 mg total) by mouth daily. 11/16/19  Yes Wendie Agreste, MD  FLUoxetine (PROZAC) 20 MG capsule Take 3 capsules (60 mg total) by mouth daily. 10/26/19  Yes Wendie Agreste, MD  ibuprofen (ADVIL,MOTRIN) 200 MG tablet Take 400 mg every 6 (six) hours as needed by mouth for headache.   Yes [provider]  loratadine (CLARITIN) 10  MG tablet Take 10 mg daily by mouth.   Yes [provider]  MYDAYIS 50 MG CP24 TAKE 1 CAPSULE BY MOUTH ONCE DAILY. FILL IN 60 DAYS 11/13/19  Yes Wendie Agreste, MD   Social History   Socioeconomic History  . Marital status: Married    Spouse name: [name]  . Number of children: 2  . Years of education: Not on file  . Highest education level: Not on file  Occupational History  . Not on file  Tobacco Use  . Smoking status: Former Smoker    Types: Cigarettes  . Smokeless tobacco: Never Used  Substance and Sexual Activity  . Alcohol use: Yes    Comment: once weekly   . Drug use: No  . Sexual activity: Yes  Other Topics Concern  . Not on file  Social History  Narrative  . Not on file   Social Determinants of Health   Financial Resource Strain:   . Difficulty of Paying Living Expenses:   Food Insecurity:   . Worried About Charity fundraiser in the Last Year:   . Arboriculturist in the Last Year:   Transportation Needs:   . Film/video editor (Medical):   Marland Kitchen Lack of Transportation (Non-Medical):   Physical Activity:   . Days of Exercise per Week:   . Minutes of Exercise per Session:   Stress:   . Feeling of Stress :   Social Connections:   . Frequency of Communication with Friends and Family:   . Frequency of Social Gatherings with Friends and Family:   . Attends Religious Services:   . Active Member of Clubs or Organizations:   . Attends Archivist Meetings:   Marland Kitchen Marital Status:   Intimate Partner Violence:   . Fear of Current or Ex-Partner:   . Emotionally Abused:   Marland Kitchen Physically Abused:   . Sexually Abused:     Review of Systems  Constitutional: Negative for fatigue and unexpected weight change.  Eyes: Negative for visual disturbance.  Respiratory: Negative for cough, chest tightness and shortness of breath.   Cardiovascular: Negative for chest pain, palpitations and leg swelling (improved. ).  Gastrointestinal: Negative for abdominal pain and blood in stool.  Neurological: Positive for dizziness (as above. ). Negative for light-headedness and headaches.    Objective:   Vitals:   01/04/20 1010  BP: (!) 127/97  Pulse: 75  Resp: 15  Temp: 98 F (36.7 C)  TempSrc: Temporal  SpO2: 97%  Weight: 214 lb 9.6 oz (97.3 kg)  Height: 6\' 2"  (1.88 m)     Physical Exam Vitals reviewed.  Constitutional:      Appearance: He is well-developed.  HENT:     Head: Normocephalic and atraumatic.  Eyes:     Pupils: Pupils are equal, round, and reactive to light.  Neck:     Vascular: No carotid bruit or JVD.  Cardiovascular:     Rate and Rhythm: Normal rate and regular rhythm.     Heart sounds: Normal heart sounds.  No murmur.  Pulmonary:     Effort: Pulmonary effort is normal.     Breath sounds: Normal breath sounds. No rales.  Skin:    General: Skin is warm and dry.  Neurological:     Mental Status: He is alert and oriented to person, place, and time.        Assessment & Plan:  Alejandro Johnson is a 39 y.o. male . Essential hypertension  Episode of dizziness  Attention deficit disorder, unspecified hyperactivity presence  Depression, unspecified depression type - Plan: FLUoxetine (PROZAC) 40 MG capsule  Blood pressure still borderline elevated, has had some lower readings at home. Pedal edema has improved since returning to 7.5 mg amlodipine. Sleep study pending, cardiology appointment this afternoon.  We'll try lower dose of fluoxetine at 40 mg as possible serotonin syndrome with combination with the Mydayis as well as elevated levels of amphetamine. If occasional spells are not improving, can look into other causes. RTC 6 weeks.   Meds ordered this encounter  Medications  . FLUoxetine (PROZAC) 40 MG capsule    Sig: Take 1 capsule (40 mg total) by mouth daily.    Dispense:  90 capsule    Refill:  1   Patient Instructions    Can try decreasing prozac to 40mg  as combination with mydayis could affect serotonin and may elevate levels of Mydayis. Let me know if that change makes any difference in the occasional spells. No other medication changes for now, I will keep an eye out for results from cardiology appointment as well as sleep study.   If you have lab work done today you will be contacted with your lab results within the next 2 weeks.  If you have not heard from [place] then please contact us. The fastest way to get your results is to register for My Chart.   IF you received an x-ray today, you will receive an invoice from [facility]. Please contact [facility] at [phone] with questions or concerns regarding your invoice.   IF you received labwork today, you will  receive an invoice from [place]. Please contact [facility] at [phone] with questions or concerns regarding your invoice.   Our billing staff will not be able to assist you with questions regarding bills from these companies.  You will be contacted with the lab results as soon as they are available. The fastest way to get your results is to activate your My Chart account. Instructions are located on the last page of this paperwork. If you have not heard from [place] regarding the results in 2 weeks, please contact this office.         Signed, [name], MD [facility]

## 2020-01-07 MED FILL — CHLORTHALIDONE 25 MG TABS: 25 | 90 days supply | Qty: 90 | Fill #0

## 2020-01-09 ENCOUNTER — Ambulatory Visit (INDEPENDENT_AMBULATORY_CARE_PROVIDER_SITE_OTHER): Payer: No Typology Code available for payment source | Admitting: Neurology

## 2020-01-09 DIAGNOSIS — I10 Essential (primary) hypertension: Secondary | ICD-10-CM

## 2020-01-09 DIAGNOSIS — F908 Attention-deficit hyperactivity disorder, other type: Secondary | ICD-10-CM

## 2020-01-09 DIAGNOSIS — G253 Myoclonus: Secondary | ICD-10-CM

## 2020-01-09 DIAGNOSIS — G4733 Obstructive sleep apnea (adult) (pediatric): Secondary | ICD-10-CM | POA: Diagnosis not present

## 2020-01-09 DIAGNOSIS — R040 Epistaxis: Secondary | ICD-10-CM

## 2020-01-10 MED FILL — MYDAYIS ER 50 MG CAPSULE: 50 | 30 days supply | Qty: 30 | Fill #0

## 2020-01-10 MED FILL — AMPHETAMINE-DEXTROAMPHETAMI: 10 | 30 days supply | Qty: 30 | Fill #0

## 2020-01-18 ENCOUNTER — Ambulatory Visit: Payer: No Typology Code available for payment source | Admitting: Family Medicine

## 2020-01-21 ENCOUNTER — Telehealth: Payer: Self-pay | Admitting: Neurology

## 2020-01-21 DIAGNOSIS — G253 Myoclonus: Secondary | ICD-10-CM | POA: Insufficient documentation

## 2020-01-21 NOTE — Telephone Encounter (Signed)
I spoke with patient's wife. I advised pt that Dr. Brett Fairy reviewed their sleep study results and found that pt has moderate sleep apnea. Dr. Brett Fairy recommends that pt starts auto CPAP. I reviewed PAP compliance expectations with the pt. Pt's wife states pt is agreeable to starting a CPAP. I advised pt that an order will be sent to a DME, [facility], and [facility] will call the pt within about one week after they file with the pt's insurance. [facility] will show the pt how to use the machine, fit for masks, and troubleshoot the CPAP if needed. A follow up apt is needed within 31-90 days after starting the machine for insurance purposes with Dr. Brett Fairy or the nurse practitioner. Pt's wife verbalized understanding of that information and will schedule later once the pt is set up with machine. Pt's wife verbalized understanding of results and had no questions at this time but was encouraged to call back if questions arise. I have sent the order to [facility] and have received confirmation that they have received the order.

## 2020-01-21 NOTE — Telephone Encounter (Signed)
-----   Message from Larey Seat, MD sent at 01/21/2020  8:59 AM EDT ----- Summary & Diagnosis:   Moderate severe Obstructive Sleep apnea was found, the AHI is at  22.6/h and accentuated during REM sleep to AHI 30.6/h. some  additional snoring is suggested by the RDI. Hear5t rate  variability in normal range and no prolonged hypoxemia was  recorded.   Recommendations:    CPAP would be indicated therapy at an Auto titration device  setting with heated humidity and interface of choice, 5-15 cm  water, 2 cm EPR.   Interpreting Physician: Larey Seat, MD

## 2020-01-21 NOTE — Procedures (Signed)
  Patient Information     First Name: Mali Last Name: Molder ID: [id number]  Birth Date: 04-11-1981 Age: 39 Gender: Male  Referring Provider: Wendie Agreste, MD BMI: 26.9 (W=209 lb, H=6' 2'')  Neck Circ.:  17 '' Epworth: 0/24  Sleep Study Information    Study Date: Jan 09, 2020 S/H/A Version: 001.001.001.001 / 4.1.1528 / 103  History:    Mali R Flatten is a right -handed White or Caucasian male with a possible sleep disorder and has a medical history of Allergy, Anxiety, Depression, and History of vasectomy (04/12/2019), sleep walking as a child (and rarely as an adult) and ADHD.  Family medical /sleep history: No other family member with OSA or with insomnia.      Summary & Diagnosis:    Moderate severe Obstructive Sleep apnea was found, the AHI is at 22.6/h and accentuated during REM sleep to AHI 30.6/h. some additional snoring is suggested by the RDI. Hear5t rate variability in normal range and no prolonged hypoxemia was recorded.   Recommendations:     CPAP would be indicated therapy at an Auto titration device setting with heated humidity and interface of choice, 5-15 cm water, 2 cm EPR.  Interpreting Physician: Larey Seat, MD            Sleep Summary  Oxygen Saturation Statistics   Start Study Time: End Study Time: Total Recording Time:  9:34:55 PM 6:21:06 AM   8 h, 46 min  Total Sleep Time % REM of Sleep Time:  7 h, 47 min 16.5    Mean: 95 Minimum: 91 Maximum: 99  Mean of Desaturations Nadirs (%):   93  Oxygen Desaturation. %: 4-9 10-20 >20 Total  Events Number Total  60 100.0  0 0.0  0 0.0  60 100.0  Oxygen Saturation: <90 <=88 <85 <80 <70  Duration (minutes): Sleep % 0.0 0.0 0.0 0.0 0.0 0.0 0.0 0.0 0.0 0.0     Respiratory Indices      Total Events REM NREM All Night  pRDI:  161  pAHI:  151 ODI:  60  pAHIc:  7  % CSR: 0.0 32.1 30.6 13.8 1.5 23.2 21.7 8.4 1.0 24.0 22.6 9.0 1.1       Pulse Rate Statistics during Sleep (BPM)        Mean: 79 Minimum: 52 Maximum: 105    Indices are calculated using technically valid sleep time of 6 h, 41 min. Central-Indices are calculated using technically valid sleep time of 6 h, 37 min. pRDI/pAHI are calculated using 02 desaturations ? 3%  Body Position Statistics  Position Supine Prone Right Left Non-Supine  Sleep (min) 55.0 67.0 205.2 140.5 412.7  Sleep % 11.8 14.3 43.9 30.0 88.2  pRDI 54.5 12.3 22.9 18.6 19.5  pAHI 52.2 12.3 21.4 16.7 18.2  ODI 20.9 4.1 10.7 4.2 7.2     Snoring Statistics Snoring Level (dB) >40 >50 >60 >70 >80 >Threshold (45)  Sleep (min) 47.3 6.2 3.9 0.0 0.0 10.5  Sleep % 10.1 1.3 0.8 0.0 0.0 2.2    Mean: 41 dB

## 2020-01-21 NOTE — Progress Notes (Signed)
Summary & Diagnosis:   Moderate severe Obstructive Sleep apnea was found, the AHI is at  22.6/h and accentuated during REM sleep to AHI 30.6/h. some  additional snoring is suggested by the RDI. Hear5t rate  variability in normal range and no prolonged hypoxemia was  recorded.   Recommendations:    CPAP would be indicated therapy at an Auto titration device  setting with heated humidity and interface of choice, 5-15 cm  water, 2 cm EPR.   Interpreting Physician: Larey Seat, MD

## 2020-01-21 NOTE — Addendum Note (Signed)
Addended by: Larey Seat on: 01/21/2020 08:59 AM   Modules accepted: Orders

## 2020-01-25 ENCOUNTER — Other Ambulatory Visit: Payer: Self-pay

## 2020-01-25 ENCOUNTER — Ambulatory Visit (INDEPENDENT_AMBULATORY_CARE_PROVIDER_SITE_OTHER): Payer: No Typology Code available for payment source | Admitting: Family Medicine

## 2020-01-25 ENCOUNTER — Encounter: Payer: Self-pay | Admitting: Family Medicine

## 2020-01-25 ENCOUNTER — Ambulatory Visit (HOSPITAL_COMMUNITY)
Admission: RE | Admit: 2020-01-25 | Discharge: 2020-01-25 | Disposition: A | Discharge status: Home / Self Care | Payer: No Typology Code available for payment source | Source: Ambulatory Visit | Attending: Internal Medicine | Admitting: Internal Medicine

## 2020-01-25 VITALS — BP 129/89 | HR 84 | Temp 98.1°F | Resp 15 | Ht 74.0 in | Wt 209.0 lb

## 2020-01-25 DIAGNOSIS — I10 Essential (primary) hypertension: Secondary | ICD-10-CM

## 2020-01-25 DIAGNOSIS — G4733 Obstructive sleep apnea (adult) (pediatric): Secondary | ICD-10-CM

## 2020-01-25 DIAGNOSIS — K5900 Constipation, unspecified: Secondary | ICD-10-CM | POA: Diagnosis not present

## 2020-01-25 DIAGNOSIS — I701 Atherosclerosis of renal artery: Secondary | ICD-10-CM | POA: Insufficient documentation

## 2020-01-25 DIAGNOSIS — F329 Major depressive disorder, single episode, unspecified: Secondary | ICD-10-CM | POA: Diagnosis not present

## 2020-01-25 DIAGNOSIS — F32A Depression, unspecified: Secondary | ICD-10-CM

## 2020-01-25 DIAGNOSIS — R682 Dry mouth, unspecified: Secondary | ICD-10-CM

## 2020-01-25 NOTE — Progress Notes (Signed)
Subjective:  Patient ID: Alejandro Johnson, male    DOB: Mar 02, 1981  Age: 39 y.o. MRN: [id number]  CC:  Chief Complaint  Patient presents with  . Hypertension    pt was keeping a log numbers have been within normal range he has not taken it recenetly, pt complains of dry mouth symptoms but unsure if related to amlodapine pt has also startes chlorthaladone   . Depression    pt also rechecking for depression and anxiety, pt noted he feels about the same dispite the 20 mg decrease from last visit    HPI Alejandro Johnson presents for   Hypertension: Currently on amlodipine 5 mg daily, chlorthalidone 25 mg daily. Evaluated April 23 with cardiology -dosage of amlodipine was decreased to 5 mg at that time and chlorthalidone was started at that time.  Basic metabolic planned in 2 weeks with follow-up in 1 month.  Some dry mouth at times in past. Seems worse on chlorthalidone - drinking more water. Also new diagnosis of moderate severe obstructive sleep apnea on recent sleep test, AHI 22.6, increased to 30 with REM.  CPAP recommended with AutoPap. Renal doppler today - no evidence of right or left renal artery stenosis.  Feels better on current meds, BP stable.  Home readings: BP Readings from Last 3 Encounters:  01/25/20 129/89  01/04/20 (!) 138/96  01/04/20 (!) 127/97   Lab Results  Component Value Date   CREATININE 1.24 10/26/2019   Depression: Concern for serotonin syndrome was brought up at previous visit.  Fluoxetine was decreased from 60 to 40 mg at April 23 visit.  Overall feels similar on this dose.  Stable. No new depression symptoms. Some fatigue.  Trying to lose weight for OSA.   Wt Readings from Last 3 Encounters:  01/25/20 209 lb (94.8 kg)  01/04/20 216 lb (98 kg)  01/04/20 214 lb 9.6 oz (97.3 kg)     Depression screen [facility] 2/9 01/25/2020 01/04/2020 12/03/2019 11/23/2019 11/16/2019  Decreased Interest 1 0 0 0 0  Down, Depressed, Hopeless 0 0 0 0 0  PHQ - 2 Score 1 0 0 0 0  Altered  sleeping 0 - - - -  Tired, decreased energy 1 - - - -  Change in appetite 0 - - - -  Feeling bad or failure about yourself  0 - - - -  Trouble concentrating 0 - - - -  Moving slowly or fidgety/restless 0 - - - -  Suicidal thoughts 0 - - - -  PHQ-9 Score 2 - - - -  Difficult doing work/chores Somewhat difficult - - - -   Constipation: Past few weeks on chlorthalidone. Hard stools - every few days at times. Trying to drink more water. No fiber supplement.  24-36 oz water.   History Patient Active Problem List   Diagnosis Date Noted  . 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 Medication management 01/04/2020  . Hypertension 12/18/2019  . Epistaxis 12/18/2019  . ADHD (attention deficit hyperactivity disorder) 02/11/2018  . CHRONIC PROSTATITIS 07/08/2008  . DYSURIA, HX OF 07/08/2008   Past Medical History:  Diagnosis Date  . ADHD   . Allergy   . Anxiety   . Depression   . History of vasectomy 04/12/2019   [facility]   Past Surgical History:  Procedure Laterality Date  . VASECTOMY     Allergies  Allergen Reactions  . Codeine Itching and Rash   Prior to Admission medications   Medication Sig Start Date End  Date Taking? Authorizing Provider  amLODipine (NORVASC) 5 MG tablet Take 1 tablet (5 mg total) by mouth daily. 01/04/20  Yes Minus Breeding, MD  chlorthalidone (HYGROTON) 25 MG tablet Take 1 tablet (25 mg total) by mouth daily. 01/04/20 04/03/20 Yes Minus Breeding, MD  FLUoxetine (PROZAC) 40 MG capsule Take 1 capsule (40 mg total) by mouth daily. 01/04/20  Yes Wendie Agreste, MD  ibuprofen (ADVIL,MOTRIN) 200 MG tablet Take 400 mg every 6 (six) hours as needed by mouth for headache.   Yes [provider]  loratadine (CLARITIN) 10 MG tablet Take 10 mg daily by mouth.   Yes [provider]  MYDAYIS 50 MG CP24 TAKE 1 CAPSULE BY MOUTH ONCE DAILY. FILL IN 60 DAYS 11/13/19  Yes Wendie Agreste, MD   Social History   Socioeconomic History  . Marital status:  Married    Spouse name: [name]  . Number of children: 2  . Years of education: Not on file  . Highest education level: Not on file  Occupational History  . Not on file  Tobacco Use  . Smoking status: Former Smoker    Types: Cigarettes  . Smokeless tobacco: Never Used  Substance and Sexual Activity  . Alcohol use: Yes    Comment: once weekly   . Drug use: No  . Sexual activity: Yes  Other Topics Concern  . Not on file  Social History Narrative   Married.  Lives with wife and 3 children.     Social Determinants of Health   Financial Resource Strain:   . Difficulty of Paying Living Expenses:   Food Insecurity:   . Worried About Charity fundraiser in the Last Year:   . Arboriculturist in the Last Year:   Transportation Needs:   . Film/video editor (Medical):   Marland Kitchen Lack of Transportation (Non-Medical):   Physical Activity:   . Days of Exercise per Week:   . Minutes of Exercise per Session:   Stress:   . Feeling of Stress :   Social Connections:   . Frequency of Communication with Friends and Family:   . Frequency of Social Gatherings with Friends and Family:   . Attends Religious Services:   . Active Member of Clubs or Organizations:   . Attends Archivist Meetings:   Marland Kitchen Marital Status:   Intimate Partner Violence:   . Fear of Current or Ex-Partner:   . Emotionally Abused:   Marland Kitchen Physically Abused:   . Sexually Abused:     Review of Systems  Constitutional: Positive for fatigue. Negative for unexpected weight change.  Eyes: Negative for visual disturbance.  Respiratory: Negative for cough, chest tightness and shortness of breath.   Cardiovascular: Negative for chest pain, palpitations and leg swelling.  Gastrointestinal: Positive for constipation. Negative for abdominal pain and blood in stool.  Neurological: Negative for dizziness, light-headedness and headaches.    Objective:   Vitals:   01/25/20 1350  BP: 129/89  Pulse: 84  Resp: 15  Temp:  98.1 F (36.7 C)  TempSrc: Temporal  SpO2: 98%  Weight: 209 lb (94.8 kg)  Height: 6\' 2"  (1.88 m)     Physical Exam Vitals reviewed.  Constitutional:      Appearance: He is well-developed.  HENT:     Head: Normocephalic and atraumatic.  Eyes:     Pupils: Pupils are equal, round, and reactive to light.  Neck:     Vascular: No carotid bruit or JVD.  Cardiovascular:     Rate and Rhythm: Normal rate and regular rhythm.     Heart sounds: Normal heart sounds. No murmur.  Pulmonary:     Effort: Pulmonary effort is normal.     Breath sounds: Normal breath sounds. No rales.  Abdominal:     General: Abdomen is flat. There is no distension.     Palpations: There is no mass.     Tenderness: There is no abdominal tenderness. There is no guarding.  Skin:    General: Skin is warm and dry.  Neurological:     Mental Status: He is alert and oriented to person, place, and time.  Psychiatric:        Mood and Affect: Mood normal.        Behavior: Behavior normal.        Assessment & Plan:  Alejandro Johnson is a 39 y.o. male . Essential hypertension  -Improved control on her regimen with chlorthalidone, low-dose of amlodipine.  Some dry mouth but has had that in the past as well with his other medications.  -Anticipate improvement in blood pressure and fatigue with start of CPAP.    Depression, unspecified depression type  -Stable on low-dose fluoxetine, continue same.  OSA (obstructive sleep apnea)  -Has not yet started CPAP.  Anticipate improvement in fatigue, blood pressure.  Recheck 3 months  Constipation, unspecified constipation type  -Fiber in the diet, increase fluid in diet, MiraLAX as needed, fiber supplement if needed, handout given, RTC precautions  Dry mouth Noticed with medications previously, worse with chlorthalidone.  Potentially could adjust medications if improvement in hypertension with CPAP.Humidifier is recommended for his CPAP, Biotene as needed for dry mouth,  increase fluid intake.  No orders of the defined types were placed in this encounter.  Patient Instructions     Try increasing water intake, citrucel if needed for fiber supplement. Fruits/vegetables/whole grain in diet can help as well. Miralax if no bowel movement, can also use Miralax (can try tonight)  Increased fluids may help dry mouth. Biotene also option over the counter for dry mouth.   CPAP  may help fatigue and blood pressure. If numbers improve, may be able to decrease dose of other meds.   Constipation, Adult Constipation is when a person:  Poops (has a bowel movement) fewer times in a week than normal.  Has a hard time pooping.  Has poop that is dry, hard, or bigger than normal. Follow these instructions at home: Eating and drinking   Eat foods that have a lot of fiber, such as: ? Fresh fruits and vegetables. ? Whole grains. ? Beans.  Eat less of foods that are high in fat, low in fiber, or overly processed, such as: ? [place] fries. ? Hamburgers. ? Cookies. ? Candy. ? Soda.  Drink enough fluid to keep your pee (urine) clear or pale yellow. General instructions  Exercise regularly or as told by your doctor.  Go to the restroom when you feel like you need to poop. Do not hold it in.  Take over-the-counter and prescription medicines only as told by your doctor. These include any fiber supplements.  Do pelvic floor retraining exercises, such as: ? Doing deep breathing while relaxing your lower belly (abdomen). ? Relaxing your pelvic floor while pooping.  Watch your condition for any changes.  Keep all follow-up visits as told by your doctor. This is important. Contact a doctor if:  You have pain that gets worse.  You have a fever.  You have not pooped for 4 days.  You throw up (vomit).  You are not hungry.  You lose weight.  You are bleeding from the anus.  You have thin, pencil-like poop (stool). Get help right away if:  You have a  fever, and your symptoms suddenly get worse.  You leak poop or have blood in your poop.  Your belly feels hard or bigger than normal (is bloated).  You have very bad belly pain.  You feel dizzy or you faint. This information is not intended to replace advice given to you by your health care provider. Make sure you discuss any questions you have with your health care provider. Document Revised: 08/12/2017 Document Reviewed: 02/18/2016 Elsevier Patient Education  [employer].   If you have lab work done today you will be contacted with your lab results within the next 2 weeks.  If you have not heard from [place] then please contact us. The fastest way to get your results is to register for My Chart.   IF you received an x-ray today, you will receive an invoice from [facility]. Please contact [facility] at [phone] with questions or concerns regarding your invoice.   IF you received labwork today, you will receive an invoice from [place]. Please contact [facility] at [phone] with questions or concerns regarding your invoice.   Our billing staff will not be able to assist you with questions regarding bills from these companies.  You will be contacted with the lab results as soon as they are available. The fastest way to get your results is to activate your My Chart account. Instructions are located on the last page of this paperwork. If you have not heard from [place] regarding the results in 2 weeks, please contact this office.         Signed, [name], MD [facility]

## 2020-01-25 NOTE — Patient Instructions (Addendum)
   Try increasing water intake, citrucel if needed for fiber supplement. Fruits/vegetables/whole grain in diet can help as well. Miralax if no bowel movement, can also use Miralax (can try tonight)  Increased fluids may help dry mouth. Biotene also option over the counter for dry mouth.   CPAP  may help fatigue and blood pressure. If numbers improve, may be able to decrease dose of other meds.   Constipation, Adult Constipation is when a person:  Poops (has a bowel movement) fewer times in a week than normal.  Has a hard time pooping.  Has poop that is dry, hard, or bigger than normal. Follow these instructions at home: Eating and drinking   Eat foods that have a lot of fiber, such as: ? Fresh fruits and vegetables. ? Whole grains. ? Beans.  Eat less of foods that are high in fat, low in fiber, or overly processed, such as: ? [place] fries. ? Hamburgers. ? Cookies. ? Candy. ? Soda.  Drink enough fluid to keep your pee (urine) clear or pale yellow. General instructions  Exercise regularly or as told by your doctor.  Go to the restroom when you feel like you need to poop. Do not hold it in.  Take over-the-counter and prescription medicines only as told by your doctor. These include any fiber supplements.  Do pelvic floor retraining exercises, such as: ? Doing deep breathing while relaxing your lower belly (abdomen). ? Relaxing your pelvic floor while pooping.  Watch your condition for any changes.  Keep all follow-up visits as told by your doctor. This is important. Contact a doctor if:  You have pain that gets worse.  You have a fever.  You have not pooped for 4 days.  You throw up (vomit).  You are not hungry.  You lose weight.  You are bleeding from the anus.  You have thin, pencil-like poop (stool). Get help right away if:  You have a fever, and your symptoms suddenly get worse.  You leak poop or have blood in your poop.  Your belly feels hard  or bigger than normal (is bloated).  You have very bad belly pain.  You feel dizzy or you faint. This information is not intended to replace advice given to you by your health care provider. Make sure you discuss any questions you have with your health care provider. Document Revised: 08/12/2017 Document Reviewed: 02/18/2016 Elsevier Patient Education  [employer].   If you have lab work done today you will be contacted with your lab results within the next 2 weeks.  If you have not heard from [place] then please contact us. The fastest way to get your results is to register for My Chart.   IF you received an x-ray today, you will receive an invoice from [facility]. Please contact [facility] at [phone] with questions or concerns regarding your invoice.   IF you received labwork today, you will receive an invoice from [place]. Please contact [facility] at [phone] with questions or concerns regarding your invoice.   Our billing staff will not be able to assist you with questions regarding bills from these companies.  You will be contacted with the lab results as soon as they are available. The fastest way to get your results is to activate your My Chart account. Instructions are located on the last page of this paperwork. If you have not heard from [place] regarding the results in 2 weeks, please contact this office.

## 2020-01-28 NOTE — Telephone Encounter (Signed)
Order has been resent to [facility] for the patient.

## 2020-02-01 ENCOUNTER — Ambulatory Visit: Payer: No Typology Code available for payment source | Admitting: Physician Assistant

## 2020-02-08 MED FILL — AMPHETAMINE-DEXTROAMPHETAMI: 10 | 30 days supply | Qty: 30 | Fill #0

## 2020-02-08 MED FILL — MYDAYIS ER 50 MG CAPSULE: 50 | 30 days supply | Qty: 30 | Fill #0

## 2020-03-10 ENCOUNTER — Encounter: Payer: Self-pay | Admitting: Family Medicine

## 2020-03-11 MED FILL — AMLODIPINE BESYLATE 5 MG TA: 5 | 90 days supply | Qty: 90 | Fill #0

## 2020-03-11 MED FILL — MYDAYIS ER 50 MG CAPSULE: 50 | 30 days supply | Qty: 30 | Fill #0

## 2020-03-11 MED FILL — AMPHETAMINE SALTS 10 MG: 10 | 30 days supply | Qty: 30 | Fill #0

## 2020-03-11 MED FILL — FLUoxetine HCL 40 MG CAPS: 40 | 90 days supply | Qty: 90 | Fill #0

## 2020-04-11 MED FILL — AMPHETAMINE SALTS 10 MG: 10 | 30 days supply | Qty: 30 | Fill #0

## 2020-04-11 MED FILL — MYDAYIS ER 50 MG CAPSULE: 50 | 30 days supply | Qty: 30 | Fill #0

## 2020-04-22 ENCOUNTER — Encounter: Payer: Self-pay | Admitting: Neurology

## 2020-04-24 ENCOUNTER — Telehealth (INDEPENDENT_AMBULATORY_CARE_PROVIDER_SITE_OTHER): Payer: No Typology Code available for payment source | Admitting: Adult Health

## 2020-04-24 DIAGNOSIS — G4733 Obstructive sleep apnea (adult) (pediatric): Secondary | ICD-10-CM | POA: Diagnosis not present

## 2020-04-24 DIAGNOSIS — Z9989 Dependence on other enabling machines and devices: Secondary | ICD-10-CM | POA: Diagnosis not present

## 2020-04-24 NOTE — Progress Notes (Signed)
PATIENT: Alejandro Johnson DOB: [birth year]  REASON FOR VISIT: follow up HISTORY FROM: patient  Virtual Visit via Video Note  I connected with Alejandro Johnson on 04/24/20 at 11:30 AM EDT by a video enabled telemedicine application located remotely at [facility] and verified that I am speaking with the correct person using two identifiers who was located at their own home.   I discussed the limitations of evaluation and management by telemedicine and the availability of in person appointments. The patient expressed understanding and agreed to proceed.   PATIENT: Alejandro Johnson DOB: Jan 15, 1981  REASON FOR VISIT: follow up HISTORY FROM: patient  HISTORY OF PRESENT ILLNESS: Today 04/24/20:  [name] is a 39 year old male with a history of obstructive sleep apnea on CPAP.  His download indicates that he uses machine nightly for compliance of 100%.  He uses machine greater than 4 hours each night.  On average he uses his machine 7 hours and 31 minutes.  His residual AHI is 0.4 on 5 to 15 cm of water with EPR 3.  Leak in the 95th percentile is 6.6 L/min.  HISTORY (Copied from Dr.Dohmeier's note) Alejandro Johnson a 39 y.o. year old  Caucasian male patientseen here upon  a referralby Dr Carlota Raspberry on 12/18/2019  Chiefconcernaccording to patient : "I have high blood pressures, up to 180 mmHg, had severe dizziness and headaches. [name] made me go to the hospital and we don't know the cause if it. I snore and Dr. Carlota Raspberry wants to rule out sleep apnea as a co-factor".   I have the pleasure of seeing Alejandro Johnson today,a right -handed White or Caucasian male with a possible sleep disorder. He  has a past medical history of Allergy, Anxiety, Depression, and History of vasectomy (04/12/2019). 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 ADHD.  Familymedical /sleep history:No other family member on CPAP with OSA, insomnia, .  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 Pets are  present. Tobacco use until 2007. ETOH use: none anymore,  Caffeine intake in form of Coffee( none ) Soda( yes) Tea ( yes) or energy drinks. protein bar eater.  Regular exercise : none during Pandemic.  Low endurance and stamina.    Sleep habits are as follows:The patient's dinner time is between 5-6 PM. The patient goes to bed at 9-10 PM and continues to sleep for many hours, wakes rarely for  bathroom breaks.   The preferred sleep position is side -ways, with the support of 1 pillows. Dreams are reportedly rare. 5-7 AM is the usual rise time. The patient wakes up when the baby wakes up.   He reports not feeling refreshed or restored in AM, with symptoms such as dry mouth. Naps are taken infrequently, lasting from 15-30 minutes. He has twitching movements.    REVIEW OF SYSTEMS: Out of a complete 14 system review of symptoms, the patient complains only of the following symptoms, and all other reviewed systems are negative.  See HPI  ALLERGIES: Allergies  Allergen Reactions  . Codeine Itching and Rash    HOME MEDICATIONS: Outpatient Medications Prior to Visit  Medication Sig Dispense Refill  . amLODipine (NORVASC) 5 MG tablet Take 1 tablet (5 mg total) by mouth daily. 90 tablet 3  . FLUoxetine (PROZAC) 40 MG capsule Take 1 capsule (40 mg total) by mouth daily. 90 capsule 1  . ibuprofen (ADVIL,MOTRIN) 200 MG  tablet Take 400 mg every 6 (six) hours as needed by mouth for headache.    . loratadine (CLARITIN) 10 MG tablet Take 10 mg daily by mouth.    Marland Kitchen MYDAYIS 50 MG CP24 TAKE 1 CAPSULE BY MOUTH ONCE DAILY. FILL IN 60 DAYS 30 capsule 0  . chlorthalidone (HYGROTON) 25 MG tablet Take 1 tablet (25 mg total) by mouth daily. 90 tablet 3   No facility-administered medications prior to visit.    PAST MEDICAL HISTORY: Past Medical History:  Diagnosis Date  . ADHD   . Allergy   . Anxiety   . Depression   . History of vasectomy 04/12/2019     [facility]    PAST SURGICAL HISTORY: Past Surgical History:  Procedure Laterality Date  . VASECTOMY      FAMILY HISTORY: Family History  Problem Relation Age of Onset  . Mental illness Mother   . Hypertension Mother   . Arthritis Father   . Mental illness Maternal Grandmother     SOCIAL HISTORY: Social History   Socioeconomic History  . Marital status: Married    Spouse name: [name]  . Number of children: 2  . Years of education: Not on file  . Highest education level: Not on file  Occupational History  . Not on file  Tobacco Use  . Smoking status: Former Smoker    Types: Cigarettes  . Smokeless tobacco: Never Used  Vaping Use  . Vaping Use: Never used  Substance and Sexual Activity  . Alcohol use: Yes    Comment: once weekly   . Drug use: No  . Sexual activity: Yes  Other Topics Concern  . Not on file  Social History Narrative   Married.  Lives with wife and 3 children.     Social Determinants of Health   Financial Resource Strain:   . Difficulty of Paying Living Expenses:   Food Insecurity:   . Worried About Charity fundraiser in the Last Year:   . Arboriculturist in the Last Year:   Transportation Needs:   . Film/video editor (Medical):   Marland Kitchen Lack of Transportation (Non-Medical):   Physical Activity:   . Days of Exercise per Week:   . Minutes of Exercise per Session:   Stress:   . Feeling of Stress :   Social Connections:   . Frequency of Communication with Friends and Family:   . Frequency of Social Gatherings with Friends and Family:   . Attends Religious Services:   . Active Member of Clubs or Organizations:   . Attends Archivist Meetings:   Marland Kitchen Marital Status:   Intimate Partner Violence:   . Fear of Current or Ex-Partner:   . Emotionally Abused:   Marland Kitchen Physically Abused:   . Sexually Abused:       PHYSICAL EXAM Generalized: Well developed, in no acute distress   Neurological examination  Mentation: Alert  oriented to time, place, history taking. Follows all commands speech and language fluent Cranial nerve II-XII:Extraocular movements were full. Facial symmetry noted. uvula tongue midline. Head turning and shoulder shrug  were normal and symmetric. Motor: Good strength throughout subjectively per patient Sensory: Sensory testing is intact to soft touch on all 4 extremities subjectively per patient Coordination: Cerebellar testing reveals good finger-nose-finger  Gait and station: Patient is able to stand from a seated position. gait is normal.  Reflexes: UTA  DIAGNOSTIC DATA (LABS, IMAGING, TESTING) - I reviewed patient records, labs, notes, testing  and imaging myself where available.  Lab Results  Component Value Date   WBC 7.3 11/23/2019   HGB 15.2 (A) 11/23/2019   HCT 45.7 (A) 11/23/2019   MCV 85.3 11/23/2019   PLT 333 02/17/2018      Component Value Date/Time   NA 139 10/26/2019 1418   K 4.8 10/26/2019 1418   CL 98 10/26/2019 1418   CO2 23 10/26/2019 1418   GLUCOSE 82 10/26/2019 1418   GLUCOSE 97 07/28/2017 1600   BUN 11 10/26/2019 1418   CREATININE 1.24 10/26/2019 1418   CALCIUM 9.7 10/26/2019 1418   PROT 7.5 02/17/2018 1620   ALBUMIN 4.9 02/17/2018 1620   AST 22 02/17/2018 1620   ALT 28 02/17/2018 1620   ALKPHOS 59 02/17/2018 1620   BILITOT 0.3 02/17/2018 1620   GFRNONAA 73 10/26/2019 1418   GFRAA 84 10/26/2019 1418   Lab Results  Component Value Date   CHOL 213 (H) 02/17/2018   HDL 51 02/17/2018   LDLCALC 115 (H) 02/17/2018   TRIG 236 (H) 02/17/2018   CHOLHDL 4.2 02/17/2018    Lab Results  Component Value Date   TSH 1.020 10/26/2019      ASSESSMENT AND PLAN 39 y.o. year old male  has a past medical history of ADHD, Allergy, Anxiety, Depression, and History of vasectomy (04/12/2019). here with:  OSA on CPAP  . CPAP compliance excellent . Residual AHI is good . Encouraged patient to continue using CPAP nightly and > 4 hours each night . F/U in 1 year  or sooner if needed  I spent 20 minutes of face-to-face and non-face-to-face time with patient.  This included previsit chart review, lab review, study review, order entry, electronic health record documentation, patient education.  Ward Givens, MSN, NP-C 04/24/2020, 11:04 AM [facility] [address] [place], [place] [address] [phone]

## 2020-04-25 ENCOUNTER — Ambulatory Visit (INDEPENDENT_AMBULATORY_CARE_PROVIDER_SITE_OTHER): Payer: No Typology Code available for payment source | Admitting: Family Medicine

## 2020-04-25 ENCOUNTER — Encounter: Payer: Self-pay | Admitting: Family Medicine

## 2020-04-25 ENCOUNTER — Other Ambulatory Visit: Payer: Self-pay

## 2020-04-25 ENCOUNTER — Other Ambulatory Visit: Payer: Self-pay | Admitting: Family Medicine

## 2020-04-25 VITALS — BP 140/90 | HR 104 | Temp 98.4°F | Ht 74.0 in | Wt 219.0 lb

## 2020-04-25 DIAGNOSIS — F32A Depression, unspecified: Secondary | ICD-10-CM

## 2020-04-25 DIAGNOSIS — Z7189 Other specified counseling: Secondary | ICD-10-CM

## 2020-04-25 DIAGNOSIS — F329 Major depressive disorder, single episode, unspecified: Secondary | ICD-10-CM

## 2020-04-25 DIAGNOSIS — G4733 Obstructive sleep apnea (adult) (pediatric): Secondary | ICD-10-CM | POA: Diagnosis not present

## 2020-04-25 DIAGNOSIS — I10 Essential (primary) hypertension: Secondary | ICD-10-CM

## 2020-04-25 DIAGNOSIS — Z7185 Encounter for immunization safety counseling: Secondary | ICD-10-CM

## 2020-04-25 LAB — BASIC METABOLIC PANEL
BUN/Creatinine Ratio: 9 (ref 9–20)
BUN: 10 mg/dL (ref 6–24)
CO2: 26 mmol/L (ref 20–29)
Calcium: 9.7 mg/dL (ref 8.7–10.2)
Chloride: 99 mmol/L (ref 96–106)
Creatinine, Ser: 1.16 mg/dL (ref 0.76–1.27)
GFR calc Af Amer: 91 mL/min/{1.73_m2} (ref >59)
GFR calc non Af Amer: 78 mL/min/{1.73_m2} (ref >59)
Glucose: 85 mg/dL (ref 65–99)
Potassium: 4.1 mmol/L (ref 3.5–5.2)
Sodium: 140 mmol/L (ref 134–144)

## 2020-04-25 MED ORDER — AMLODIPINE BESYLATE 2.5 MG PO TABS: 2.5000 mg | ORAL_TABLET | Freq: Every day | ORAL | 1 refills | Status: DC

## 2020-04-25 MED ORDER — FLUOXETINE HCL 20 MG PO CAPS: 60.0000 mg | ORAL_CAPSULE | Freq: Every day | ORAL | 1 refills | Status: DC

## 2020-04-25 MED FILL — FLUoxetine HCL 20 MG CAPS: 20 | 90 days supply | Qty: 270 | Fill #0

## 2020-04-25 NOTE — Patient Instructions (Addendum)
  If blood pressure over 140/90, then consider adding additional 2.5mg  amlodipine.  Let me know if you start that med.  I do recommend getting covid vaccine as soon as possible.   Thanks for coming in today and take care.   If you have lab work done today you will be contacted with your lab results within the next 2 weeks.  If you have not heard from [place] then please contact us. The fastest way to get your results is to register for My Chart.   IF you received an x-ray today, you will receive an invoice from [facility]. Please contact [facility] at [phone] with questions or concerns regarding your invoice.   IF you received labwork today, you will receive an invoice from [facility]. Please contact [facility] at [phone] with questions or concerns regarding your invoice.   Our billing staff will not be able to assist you with questions regarding bills from these companies.  You will be contacted with the lab results as soon as they are available. The fastest way to get your results is to activate your My Chart account. Instructions are located on the last page of this paperwork. If you have not heard from [place] regarding the results in 2 weeks, please contact this office.

## 2020-04-25 NOTE — Progress Notes (Signed)
Subjective:  Patient ID: Alejandro Johnson, male    DOB: Feb 03, 1981  Age: 39 y.o. MRN: [id number]  CC:  Chief Complaint  Patient presents with   Follow-up    on sleep apnia, hypertension, and depression. Pt reports he is doing better with his sleep apnia. pt reports he has gotten use to the mask and is sleeping much better now. Pt reports no issues with his BP since last OV. Pt reports he was checking his BP at home daily not so much now, but when he was checking it he was always around 140/90. Pt reports no issues with his depression, but pt is thinking he is starting to maybe develop a tolerance to his current medication.    HPI Alejandro Johnson presents for   Hypertension: See previous visits, and evaluation with cardiology previously.  But also be related to sleep apnea.  Treated with chlorthalidone 25 mg daily, amlodipine 5 mg daily.  Renal Doppler without evidence of right or left renal artery stenosis. Home readings: Similar to in office around 140/80 usually. Some 130's at times.  Stopped chlorthalidone - dry mouth, erectile dysfunction, constipation - no relief with laxative, mag citrate. Off for 2 months. Feels better in all areas off that med.  BP Readings from Last 3 Encounters:  04/25/20 140/90  01/25/20 129/89  01/04/20 (!) 138/96   Lab Results  Component Value Date   CREATININE 1.24 10/26/2019   Depression: Treated with fluoxetine, dosage has been adjusted previously from 40 to 60 mg, then back down to 40 mg with possible concern for serotonin syndrome, but less likely.  Continued 40 mg last visit. Feels like plateau in symptoms/tolerance. Some increased down feeling at times. Better on 60mg  in past.  Still on Myadis 50mg  and 10mg  adderrall in am, and 1/4-1/2 in afternoon - followed by [facility].   Depression screen [facility] 2/9 04/25/2020 01/25/2020 01/04/2020 12/03/2019 11/23/2019  Decreased Interest 0 1 0 0 0  Down, Depressed, Hopeless 0 0 0 0 0  PHQ - 2 Score 0 1  0 0 0  Altered sleeping - 0 - - -  Tired, decreased energy - 1 - - -  Change in appetite - 0 - - -  Feeling bad or failure about yourself  - 0 - - -  Trouble concentrating - 0 - - -  Moving slowly or fidgety/restless - 0 - - -  Suicidal thoughts - 0 - - -  PHQ-9 Score - 2 - - -  Difficult doing work/chores - Somewhat difficult - - -    Obstructive sleep apnea on CPAP. New diagnosis when discussed in May.  AHI 22.6, increasing to 30 with REM.  Started on AutoPap. Visit yesterday with sleep specialist. Residual AHI 0.4cm on 5-15cm H20.   Feels some better.   covid vaccine - considering getting this now.   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 Authorizing Provider  amLODipine (NORVASC) 5 MG tablet Take 1 tablet (5 mg total) by mouth daily. 01/04/20  Yes Minus Breeding, MD  FLUoxetine (PROZAC) 40 MG capsule Take 1 capsule (40 mg total) by mouth daily. 01/04/20  Yes Wendie Agreste, MD  ibuprofen (ADVIL,MOTRIN) 200 MG tablet Take 400 mg every 6 (six) hours as needed by mouth for headache.   Yes [provider]  loratadine (CLARITIN) 10 MG tablet Take 10 mg daily by mouth.   Yes [provider]  MYDAYIS 50 MG CP24 TAKE 1 CAPSULE BY MOUTH ONCE DAILY. FILL IN 60 DAYS 11/13/19  Yes Wendie Agreste, MD   Social History   Socioeconomic History   Marital  status: Married    Spouse name: [name]   Number of children: 2   Years of education: Not on file   Highest education level: Not on file  Occupational History   Not on file  Tobacco Use   Smoking status: Former Smoker    Types: Cigarettes   Smokeless tobacco: Never Used  Scientific laboratory technician Use: Never used  Substance and Sexual Activity   Alcohol use: Yes    Comment: once weekly    Drug use: No   Sexual activity: Yes  Other Topics Concern   Not on file  Social History Narrative   Married.  Lives with wife and 3 children.     Social Determinants of Health   Financial Resource Strain:    Difficulty of Paying Living Expenses:   Food Insecurity:    Worried About Charity fundraiser in the Last Year:    Arboriculturist in the Last Year:   Transportation Needs:    Film/video editor (Medical):    Lack of Transportation (Non-Medical):   Physical Activity:    Days of Exercise per Week:    Minutes of Exercise per Session:   Stress:    Feeling of Stress :   Social Connections:    Frequency of Communication with Friends and Family:    Frequency of Social Gatherings with Friends and Family:    Attends Religious Services:    Active Member of Clubs or Organizations:    Attends Music therapist:    Marital Status:   Intimate Partner Violence:    Fear of Current or Ex-Partner:    Emotionally Abused:    Physically Abused:    Sexually Abused:     Review of Systems  Constitutional: Negative for fatigue and unexpected weight change.  Eyes: Negative for visual disturbance.  Respiratory: Negative for cough, chest tightness and shortness of breath.   Cardiovascular: Negative for chest pain, palpitations and leg swelling.  Gastrointestinal: Negative for abdominal pain and blood in stool.  Neurological: Negative for dizziness, light-headedness and headaches.     Objective:   Vitals:   04/25/20 1002 04/25/20 1009  BP: (!) 151/103  140/90  Pulse: (!) 110 (!) 104  Temp: 98.4 F (36.9 C)   TempSrc: Temporal   SpO2: 97%   Weight: 219 lb (99.3 kg)   Height: 6\' 2"  (1.88 m)      Physical Exam Vitals reviewed.  Constitutional:      Appearance: He is well-developed.  HENT:     Head: Normocephalic and atraumatic.  Eyes:     Pupils: Pupils are equal, round, and reactive to light.  Neck:  Vascular: No carotid bruit or JVD.  Cardiovascular:     Rate and Rhythm: Normal rate and regular rhythm.     Heart sounds: Normal heart sounds. No murmur heard.   Pulmonary:     Effort: Pulmonary effort is normal.     Breath sounds: Normal breath sounds. No rales.  Skin:    General: Skin is warm and dry.  Neurological:     Mental Status: He is alert and oriented to person, place, and time.      Assessment & Plan:  Alejandro Johnson is a 39 y.o. male . Depression, unspecified depression type - Plan: FLUoxetine (PROZAC) 20 MG capsule  -Trial of increased dose of fluoxetine 60 mg daily, continue follow-up with [facility] attention specialist.  If persistent depressive symptoms, consider psychiatry eval for medication adjustments.  Essential hypertension - Plan: amLODipine (NORVASC) 2.5 MG tablet, Basic metabolic panel  -Borderline, option of additional 2.5 mg amlodipine if persistent elevations, continue 5 mg daily for now.  OSA (obstructive sleep apnea)  -Tolerating CPAP, reviewed notes from yesterday with improved AHI on appointment.  Vaccine counseling  -Covid vaccine recommended which he does plan on getting soon.  All questions were answered  Meds ordered this encounter  Medications   amLODipine (NORVASC) 2.5 MG tablet    Sig: Take 1 tablet (2.5 mg total) by mouth daily.    Dispense:  90 tablet    Refill:  1   FLUoxetine (PROZAC) 20 MG capsule    Sig: Take 3 capsules (60 mg total) by mouth daily.    Dispense:  270 capsule    Refill:  1   Patient Instructions    If blood pressure over 140/90, then consider  adding additional 2.5mg  amlodipine.  Let me know if you start that med.  I do recommend getting covid vaccine as soon as possible.   Thanks for coming in today and take care.   If you have lab work done today you will be contacted with your lab results within the next 2 weeks.  If you have not heard from [place] then please contact us. The fastest way to get your results is to register for My Chart.   IF you received an x-ray today, you will receive an invoice from [facility]. Please contact [facility] at [phone] with questions or concerns regarding your invoice.   IF you received labwork today, you will receive an invoice from [facility]. Please contact [facility] at [phone] with questions or concerns regarding your invoice.   Our billing staff will not be able to assist you with questions regarding bills from these companies.  You will be contacted with the lab results as soon as they are available. The fastest way to get your results is to activate your My Chart account. Instructions are located on the last page of this paperwork. If you have not heard from [place] regarding the results in 2 weeks, please contact this office.         Signed, [name], MD [facility]

## 2020-05-05 MED FILL — AMPHETAMINE SALTS 10 MG: 10 | 30 days supply | Qty: 45 | Fill #0

## 2020-05-13 MED FILL — MYDAYIS ER 50 MG CAPSULE: 50 | 30 days supply | Qty: 30 | Fill #0

## 2020-06-12 MED FILL — MYDAYIS ER 50 MG CAPSULE: 50 | 30 days supply | Qty: 30 | Fill #0

## 2020-06-12 MED FILL — AMPHETAMINE SALTS 10 MG: 10 | 30 days supply | Qty: 45 | Fill #0

## 2020-07-04 ENCOUNTER — Other Ambulatory Visit (HOSPITAL_COMMUNITY): Payer: Self-pay | Admitting: Internal Medicine

## 2020-07-10 MED FILL — AMPHETAMINE SALTS 10 MG: 10 | 30 days supply | Qty: 45 | Fill #0

## 2020-07-10 MED FILL — MYDAYIS ER 50 MG CAPSULE: 50 | 30 days supply | Qty: 30 | Fill #0

## 2020-07-14 ENCOUNTER — Encounter: Payer: Self-pay | Admitting: Family Medicine

## 2020-07-14 DIAGNOSIS — I10 Essential (primary) hypertension: Secondary | ICD-10-CM

## 2020-07-15 MED ORDER — AMLODIPINE BESYLATE 2.5 MG PO TABS: 2.5000 mg | ORAL_TABLET | Freq: Every day | ORAL | 1 refills | Status: DC

## 2020-07-18 MED FILL — AMLODIPINE BESYLATE 10 MG T: 10 | 90 days supply | Qty: 90 | Fill #1

## 2020-08-11 ENCOUNTER — Other Ambulatory Visit (HOSPITAL_COMMUNITY): Payer: Self-pay | Admitting: Internal Medicine

## 2020-08-11 MED FILL — MYDAYIS ER 50 MG CAPSULE: 50 | 30 days supply | Qty: 30 | Fill #0

## 2020-08-11 MED FILL — AMPHETAMINE SALTS 10 MG: 10 | 30 days supply | Qty: 45 | Fill #0

## 2020-08-28 MED FILL — FLUoxetine HCL 20 MG CAPS: 20 | 90 days supply | Qty: 270 | Fill #1

## 2020-09-11 ENCOUNTER — Other Ambulatory Visit (HOSPITAL_COMMUNITY): Payer: Self-pay | Admitting: Internal Medicine

## 2020-09-11 MED FILL — MYDAYIS ER 50 MG CAPSULE: 50 | 30 days supply | Qty: 30 | Fill #0

## 2020-09-11 MED FILL — AMPHETAMINE SALTS 10 MG: 10 | 30 days supply | Qty: 45 | Fill #0

## 2020-10-12 ENCOUNTER — Other Ambulatory Visit (HOSPITAL_COMMUNITY): Payer: Self-pay | Admitting: Internal Medicine

## 2020-10-13 MED FILL — AMPHETAMINE SALTS 10 MG: 10 | 30 days supply | Qty: 45 | Fill #0

## 2020-10-13 MED FILL — MYDAYIS ER 50 MG CAPSULE: 50 | 30 days supply | Qty: 30 | Fill #0

## 2020-10-30 ENCOUNTER — Encounter: Payer: Self-pay | Admitting: Family Medicine

## 2020-10-31 ENCOUNTER — Ambulatory Visit: Payer: No Typology Code available for payment source | Admitting: Family Medicine

## 2020-11-04 ENCOUNTER — Encounter: Payer: Self-pay | Admitting: Neurology

## 2020-11-04 DIAGNOSIS — G253 Myoclonus: Secondary | ICD-10-CM

## 2020-11-04 DIAGNOSIS — G4733 Obstructive sleep apnea (adult) (pediatric): Secondary | ICD-10-CM

## 2020-11-14 ENCOUNTER — Other Ambulatory Visit (HOSPITAL_COMMUNITY): Payer: Self-pay | Admitting: Internal Medicine

## 2020-11-14 MED FILL — AMPHETAMINE SALTS 10 MG: 10 | 30 days supply | Qty: 45 | Fill #0

## 2020-11-14 MED FILL — MYDAYIS ER 50 MG CAPSULE: 50 | 30 days supply | Qty: 30 | Fill #0

## 2020-11-18 ENCOUNTER — Encounter: Payer: Self-pay | Admitting: Family Medicine

## 2020-11-18 ENCOUNTER — Other Ambulatory Visit: Payer: Self-pay | Admitting: Family Medicine

## 2020-11-18 DIAGNOSIS — I10 Essential (primary) hypertension: Secondary | ICD-10-CM

## 2020-11-18 MED ORDER — AMLODIPINE BESYLATE 5 MG PO TABS: 5.0000 mg | ORAL_TABLET | Freq: Every day | ORAL | 0 refills | Status: DC

## 2020-11-18 MED ORDER — AMLODIPINE BESYLATE 2.5 MG PO TABS: 2.5000 mg | ORAL_TABLET | Freq: Every day | ORAL | 1 refills | Status: DC

## 2020-11-18 MED FILL — AMLODIPINE 2.5 MG TABLET: 2.5 | 90 days supply | Qty: 90 | Fill #0

## 2020-11-18 MED FILL — AMLODIPINE BESYLATE 5 MG TA: 5 | 90 days supply | Qty: 90 | Fill #0

## 2020-11-19 ENCOUNTER — Other Ambulatory Visit: Payer: Self-pay | Admitting: Neurology

## 2020-11-25 ENCOUNTER — Other Ambulatory Visit: Payer: Self-pay | Admitting: Neurology

## 2020-11-25 MED ORDER — MODAFINIL 200 MG PO TABS: ORAL_TABLET | ORAL | 0 refills | Status: DC

## 2020-11-25 NOTE — Telephone Encounter (Signed)
Pt agreed to the plan to start modafinil

## 2020-12-01 ENCOUNTER — Other Ambulatory Visit: Payer: Self-pay | Admitting: Family Medicine

## 2020-12-01 ENCOUNTER — Encounter: Payer: Self-pay | Admitting: Family Medicine

## 2020-12-01 DIAGNOSIS — F32A Depression, unspecified: Secondary | ICD-10-CM

## 2020-12-01 MED ORDER — FLUOXETINE HCL 20 MG PO CAPS: 60.0000 mg | ORAL_CAPSULE | Freq: Every day | ORAL | 0 refills | Status: DC

## 2020-12-01 MED FILL — FLUoxetine HCL 20 MG CAPS: 20 | 30 days supply | Qty: 90 | Fill #0

## 2020-12-04 ENCOUNTER — Other Ambulatory Visit (HOSPITAL_COMMUNITY): Payer: Self-pay | Admitting: Internal Medicine

## 2020-12-04 MED FILL — AMPHETAMINE-DEXTROAMPHETAMI: 20 | 30 days supply | Qty: 45 | Fill #0

## 2020-12-04 NOTE — Progress Notes (Deleted)
Cardiology Office Note   Date:  12/04/2020   ID:  Alejandro Johnson, DOB 09-May-1981, MRN [id number]  PCP:  Wendie Agreste, MD  Cardiologist:   No primary care provider on file. PCP:  Wendie Agreste, MD  No chief complaint on file.     History of Present Illness: Alejandro R Schumpert is a 40 y.o. male who is referred by Wendie Agreste, MD for evaluation of HTN.    He was in the ED for this in Feb 2021.  BP was 190/112.   He has been diagnosed with and is being treated for sleep apnea .   ***      *** As I look back through his readings it interesting that he suddenly developed hypertension in February of this year.  He presented to the emergency room because he was having headache.  He did not require treatment was started on amlodipine.  By his description there have been multiple med changes and he is actually taking 7 and half milligrams of Norvasc now at night.  He does not like it because he feels a little sluggish and has some lower extremity swelling with this.  His blood pressure seems to be a little bit better controlled.  He is also decreased his caffeine and his sodium intake.  He does take stimulants for ADHD but he has been on these for a long time and has not changed.  He had his Prozac reduced recently.  He denies any chest pressure, neck or arm discomfort.  He said no new shortness of breath, PND or orthopnea.  He said no palpitations, presyncope or syncope.  He does not exercise much as he used to because he has a 63-1/2-year-old.  He has not had any flushing.  Past Medical History:  Diagnosis Date  . ADHD   . Allergy   . Anxiety   . Depression   . History of vasectomy 04/12/2019   [facility]    Past Surgical History:  Procedure Laterality Date  . VASECTOMY       Current Outpatient Medications  Medication Sig Dispense Refill  . amLODipine (NORVASC) 2.5 MG tablet Take 1 tablet (2.5 mg total) by mouth daily. 90 tablet 1  . amLODipine (NORVASC) 5 MG tablet Take  1 tablet (5 mg total) by mouth daily. 90 tablet 0  . FLUoxetine (PROZAC) 20 MG capsule Take 3 capsules (60 mg total) by mouth daily. 90 capsule 0  . ibuprofen (ADVIL,MOTRIN) 200 MG tablet Take 400 mg every 6 (six) hours as needed by mouth for headache.    . loratadine (CLARITIN) 10 MG tablet Take 10 mg daily by mouth.    . modafinil (PROVIGIL) 200 MG tablet Start by taking 0.5 tablet in the morning, if tolerates well and needs the additional 0.5 tablet may increase to 1 full tablet. 30 tablet 0  . MYDAYIS 50 MG CP24 TAKE 1 CAPSULE BY MOUTH ONCE DAILY. FILL IN 60 DAYS 30 capsule 0   No current facility-administered medications for this visit.    Allergies:   Codeine    ROS:  Please see the history of present illness.   Otherwise, review of systems are positive for ***.   All other systems are reviewed and negative.    PHYSICAL EXAM: VS:  There were no vitals taken for this visit. , BMI There is no height or weight on file to calculate BMI. GENERAL:  Well appearing NECK:  No jugular venous distention, waveform  within normal limits, carotid upstroke brisk and symmetric, no bruits, no thyromegaly LUNGS:  Clear to auscultation bilaterally CHEST:  Unremarkable HEART:  PMI not displaced or sustained,S1 and S2 within normal limits, no S3, no S4, no clicks, no rubs, *** murmurs ABD:  Flat, positive bowel sounds normal in frequency in pitch, no bruits, no rebound, no guarding, no midline pulsatile mass, no hepatomegaly, no splenomegaly EXT:  2 plus pulses throughout, no edema, no cyanosis no clubbing    ***GENERAL:  Well appearing HEENT:  Pupils equal round and reactive, fundi not visualized, oral mucosa unremarkable NECK:  No jugular venous distention, waveform within normal limits, carotid upstroke brisk and symmetric, no bruits, no thyromegaly LYMPHATICS:  No cervical, inguinal adenopathy LUNGS:  Clear to auscultation bilaterally BACK:  No CVA tenderness CHEST:  Unremarkable HEART:  PMI  not displaced or sustained,S1 and S2 within normal limits, no S3, no S4, no clicks, no rubs, NO murmurs ABD:  Flat, positive bowel sounds normal in frequency in pitch, no bruits, no rebound, no guarding, no midline pulsatile mass, no hepatomegaly, no splenomegaly EXT:  2 plus pulses throughout, no edema, no cyanosis no clubbing SKIN:  No rashes no nodules NEURO:  Cranial nerves II through XII grossly intact, motor grossly intact throughout PSYCH:  Cognitively intact, oriented to person place and time    EKG:  EKG is *** ordered today. The ekg ordered today demonstrates sinus rhythm, rate ***, axis within normal intervals within normal limits, no acute ST-T wave changes.   Recent Labs: 04/25/2020: BUN 10; Creatinine, Ser 1.16; Potassium 4.1; Sodium 140    Lipid Panel    Component Value Date/Time   CHOL 213 (H) 02/17/2018 1620   TRIG 236 (H) 02/17/2018 1620   HDL 51 02/17/2018 1620   CHOLHDL 4.2 02/17/2018 1620   LDLCALC 115 (H) 02/17/2018 1620      Wt Readings from Last 3 Encounters:  04/25/20 219 lb (99.3 kg)  01/25/20 209 lb (94.8 kg)  01/04/20 216 lb (98 kg)      Other studies Reviewed: Additional studies/ records that were reviewed today include:  *** Review of the above records demonstrates:  Please see elsewhere in the note.     ASSESSMENT AND PLAN:  HTN:  *** I do not see a secondary etiology.  I do think this very well could be related to his ADHD medications but he does need this.  He wants to see if we can try something different than the Norvasc.  I am going to go down to 5 mg and add chlorthalidone 25 mg every morning.  He can get a basic metabolic profile in a couple of weeks.  He understands we might need more adjustments.  Although I do not strongly suspect another secondary etiology I will check a renal ultrasound given the sudden disappearance of his hypertension.  SLEEP APNEA:  He is diagnosed with this and is being treated.  ***  Current medicines are  reviewed at length with the patient today.  The patient does not have concerns regarding medicines.  The following changes have been made:   ***  Labs/ tests ordered today include: ***  No orders of the defined types were placed in this encounter.    Disposition:   FU with APP in ***   Signed, Minus Breeding, MD  12/04/2020 3:16 PM    [place]

## 2020-12-05 ENCOUNTER — Ambulatory Visit: Payer: No Typology Code available for payment source | Admitting: Cardiology

## 2020-12-05 DIAGNOSIS — I10 Essential (primary) hypertension: Secondary | ICD-10-CM

## 2020-12-18 ENCOUNTER — Other Ambulatory Visit (HOSPITAL_COMMUNITY): Payer: Self-pay

## 2020-12-18 ENCOUNTER — Other Ambulatory Visit: Payer: Self-pay

## 2020-12-19 ENCOUNTER — Other Ambulatory Visit (HOSPITAL_COMMUNITY): Payer: Self-pay

## 2020-12-19 MED ORDER — MYDAYIS 50 MG PO CP24
ORAL_CAPSULE | ORAL | 0 refills | Status: DC
Start: 2020-12-19 — End: 2020-12-24
  Filled 2020-12-19 – 2020-12-24 (×2): qty 30, 30d supply, fill #0

## 2020-12-22 ENCOUNTER — Other Ambulatory Visit (HOSPITAL_COMMUNITY): Payer: Self-pay

## 2020-12-24 ENCOUNTER — Other Ambulatory Visit (HOSPITAL_COMMUNITY): Payer: Self-pay

## 2020-12-24 ENCOUNTER — Other Ambulatory Visit: Payer: Self-pay

## 2020-12-24 MED ORDER — MYDAYIS 50 MG PO CP24
ORAL_CAPSULE | ORAL | 0 refills | Status: DC
  Filled 2020-12-24: qty 30, 30d supply, fill #0

## 2021-01-07 ENCOUNTER — Other Ambulatory Visit (HOSPITAL_COMMUNITY): Payer: Self-pay

## 2021-01-07 MED ORDER — AMPHETAMINE-DEXTROAMPHETAMINE 20 MG PO TABS
ORAL_TABLET | ORAL | 0 refills | Status: DC
  Filled 2021-01-07: qty 45, 30d supply, fill #0

## 2021-01-23 ENCOUNTER — Other Ambulatory Visit: Payer: Self-pay

## 2021-01-23 ENCOUNTER — Ambulatory Visit (INDEPENDENT_AMBULATORY_CARE_PROVIDER_SITE_OTHER): Payer: No Typology Code available for payment source | Admitting: Nurse Practitioner

## 2021-01-23 ENCOUNTER — Other Ambulatory Visit (HOSPITAL_COMMUNITY): Payer: Self-pay

## 2021-01-23 VITALS — BP 162/97 | HR 90 | Temp 99.2°F | Resp 20 | Ht 74.0 in | Wt 226.0 lb

## 2021-01-23 DIAGNOSIS — Z0001 Encounter for general adult medical examination with abnormal findings: Secondary | ICD-10-CM | POA: Diagnosis not present

## 2021-01-23 DIAGNOSIS — Z7689 Persons encountering health services in other specified circumstances: Secondary | ICD-10-CM

## 2021-01-23 DIAGNOSIS — I10 Essential (primary) hypertension: Secondary | ICD-10-CM | POA: Diagnosis not present

## 2021-01-23 DIAGNOSIS — G4733 Obstructive sleep apnea (adult) (pediatric): Secondary | ICD-10-CM | POA: Insufficient documentation

## 2021-01-23 DIAGNOSIS — F908 Attention-deficit hyperactivity disorder, other type: Secondary | ICD-10-CM

## 2021-01-23 DIAGNOSIS — R5383 Other fatigue: Secondary | ICD-10-CM | POA: Insufficient documentation

## 2021-01-23 MED ORDER — OLMESARTAN MEDOXOMIL 20 MG PO TABS
20.0000 mg | ORAL_TABLET | Freq: Every day | ORAL | 1 refills | Status: DC
  Filled 2021-01-23: qty 30, 30d supply, fill #0

## 2021-01-23 MED ORDER — AMPHETAMINE-DEXTROAMPHETAMINE 10 MG PO TABS: ORAL_TABLET | ORAL | 0 refills | Status: DC

## 2021-01-23 MED ORDER — MYDAYIS 50 MG PO CP24
ORAL_CAPSULE | ORAL | 0 refills | Status: DC
  Filled 2021-01-23: qty 30, 30d supply, fill #0

## 2021-01-23 NOTE — Patient Instructions (Signed)
Please have fasting labs drawn 2-3 days prior to your appointment so we can discuss the results during your office visit.  

## 2021-01-23 NOTE — Assessment & Plan Note (Signed)
-  transferring from Dr. Carlota Raspberry; records in [place]

## 2021-01-23 NOTE — Assessment & Plan Note (Signed)
-  no issue today

## 2021-01-23 NOTE — Assessment & Plan Note (Addendum)
-  amlodipine has been causing leg swelling; he is requesting to stop amlodipine -STOP amlodipine -he will check his BP 1-2 x per day -Rx. olmesartan -he will take amlodipine 5 mg is BP > 170/110

## 2021-01-23 NOTE — Progress Notes (Signed)
New Patient Office Visit  Subjective:  Patient ID: Alejandro Johnson, male    DOB: Dec 02, 1980  Age: 40 y.o. MRN: [id number]  CC:  Chief Complaint  Patient presents with  . New Patient (Initial Visit)    HPI Alejandro Johnson presents for new patient visit. Transferring care from [place] with Dr. Carlota Raspberry. He is followed by Dr. Johnnye Sima with [facility] and Dr. Brett Fairy with [facility]. Last physical was over a year ago. Last labs were drawn 04/25/20, and it didn't include a lipid panel.  He states that he has high blood pressure, and amlodipine is causing leg swelling.  He takes this at night before bed because he gets dizziness and "feels weird". He states he has tried nothing other than amlodipine.  He has CPAP machine, and that is managed by [facility] and [facility].  Past Medical History:  Diagnosis Date  . ADHD   . Allergy    seasonal  . Anxiety   . Chronic prostatitis 07/08/2008   Qualifier: Diagnosis of  By: Sherren Mocha MD, Jory Ee   . Depression   . Depression    Phreesia 01/23/2021  . History of vasectomy 04/12/2019   [facility]  . Hypertension    Phreesia 01/23/2021    Past Surgical History:  Procedure Laterality Date  . VASECTOMY      Family History  Problem Relation Age of Onset  . Mental illness Mother   . Hypertension Mother   . Arthritis Father   . Mental illness Maternal Grandmother     Social History   Socioeconomic History  . Marital status: Married    Spouse name: [name]  . Number of children: 3  . Years of education: Not on file  . Highest education level: Not on file  Occupational History  . Occupation: Stay at [employer] Use  . Smoking status: Former Smoker    Packs/day: 1.00    Years: 10.00    Pack years: 10.00    Types: Cigarettes    Quit date: 2007    Years since quitting: 15.3  . Smokeless tobacco: Never Used  Vaping Use  . Vaping Use: Never used  Substance and Sexual Activity  . Alcohol use: Not  Currently  . Drug use: No  . Sexual activity: Yes    Birth control/protection: Surgical  Other Topics Concern  . Not on file  Social History Narrative   Married.  Lives with wife and 3 children.     Social Determinants of Health   Financial Resource Strain: Not on file  Food Insecurity: Not on file  Transportation Needs: Not on file  Physical Activity: Not on file  Stress: Not on file  Social Connections: Not on file  Intimate Partner Violence: Not on file    ROS Review of Systems  Constitutional: Negative.   Respiratory: Negative.   Cardiovascular: Negative.   Musculoskeletal: Negative.   Psychiatric/Behavioral: Negative.     Objective:   Today's Vitals: BP (!) 162/97   Pulse 90   Temp 99.2 F (37.3 C)   Resp 20   Ht 6' 2"  (1.88 m)   Wt 226 lb (102.5 kg)   SpO2 97%   BMI 29.02 kg/m   Physical Exam Constitutional:      Appearance: Normal appearance.  Cardiovascular:     Rate and Rhythm: Normal rate and regular rhythm.     Pulses: Normal pulses.     Heart sounds: Normal heart sounds.  Pulmonary:  Effort: Pulmonary effort is normal.     Breath sounds: Normal breath sounds.  Musculoskeletal:        General: Normal range of motion.  Neurological:     Mental Status: He is alert.  Psychiatric:        Mood and Affect: Mood normal.        Behavior: Behavior normal.        Thought Content: Thought content normal.        Judgment: Judgment normal.     Assessment & Plan:   Problem List Items Addressed This Visit      Cardiovascular and Mediastinum   Accelerated hypertension    -amlodipine has been causing leg swelling; he is requesting to stop amlodipine -STOP amlodipine -he will check his BP 1-2 x per day -Rx. olmesartan -he will take amlodipine 5 mg is BP > 170/110      Relevant Medications   olmesartan (BENICAR) 20 MG tablet     Respiratory   OSA (obstructive sleep apnea)    -no issue today        Other   ADHD (attention deficit  hyperactivity disorder)   Relevant Medications   amphetamine-dextroamphetamine (ADDERALL) 10 MG tablet   Other Relevant Orders   CBC with Differential/Platelet   CMP14+EGFR   Lipid Panel With LDL/HDL Ratio   Encounter to establish care - Primary    -transferring from Dr. Carlota Raspberry; records in Epic      Fatigue    -states he had tick bite and had fatigue after the tick bite -will check lyme titer as well as TSH, Vit D      Relevant Orders   TSH   Lyme Ab/Western Blot Reflex   VITAMIN D 25 Hydroxy (Vit-D Deficiency, Fractures)    Other Visit Diagnoses    Encounter for general adult medical examination with abnormal findings       Relevant Orders   CBC with Differential/Platelet   CMP14+EGFR   Lipid Panel With LDL/HDL Ratio   TSH   Lyme Ab/Western Blot Reflex   VITAMIN D 25 Hydroxy (Vit-D Deficiency, Fractures)      Outpatient Encounter Medications as of 01/23/2021  Medication Sig  . loratadine (CLARITIN) 10 MG tablet Take 10 mg daily by mouth.  . olmesartan (BENICAR) 20 MG tablet Take 1 tablet (20 mg total) by mouth daily.  . [DISCONTINUED] amLODipine (NORVASC) 2.5 MG tablet TAKE 1 TABLET BY MOUTH DAILY  . [DISCONTINUED] amLODipine (NORVASC) 5 MG tablet TAKE 1 TABLET BY MOUTH DAILY  . [DISCONTINUED] Amphet-Dextroamphet 3-Bead ER (MYDAYIS) 50 MG CP24 Take 1 capsule by mouth daily  . [DISCONTINUED] amphetamine-dextroamphetamine (ADDERALL) 20 MG tablet Take one half tablet by mouth in am and 1 tablet in afternoon  . amphetamine-dextroamphetamine (ADDERALL) 10 MG tablet Take a half tablet in the morning and a whole tablet in the afternoon around 1 pm.  . MYDAYIS 50 MG CP24 TAKE 1 CAPSULE BY MOUTH ONCE DAILY. FILL IN 60 DAYS  . [DISCONTINUED] Amphet-Dextroamphet 3-Bead ER 50 MG CP24 TAKE 1 CAPSULE BY MOUTH ONCE A DAY  . [DISCONTINUED] Amphet-Dextroamphet 3-Bead ER 50 MG CP24 TAKE 1 CAPSULE BY MOUTH ONCE A DAY  . [DISCONTINUED] Amphet-Dextroamphet 3-Bead ER 50 MG CP24 TAKE 1 CAPSULE  BY MOUTH ONCE A DAY  . [DISCONTINUED] Amphet-Dextroamphet 3-Bead ER 50 MG CP24 TAKE 1 CAPSULE BY MOUTH ONCE A DAY  . [DISCONTINUED] Amphet-Dextroamphet 3-Bead ER 50 MG CP24 TAKE 1 CAPSULE BY MOUTH ONCE DAILY  . [DISCONTINUED] amphetamine-dextroamphetamine (ADDERALL)  10 MG tablet TAKE 1&1/2 TABLETS BY MOUTH ONCE A DAY AS NEEDED IN THE AFTERNOON  . [DISCONTINUED] amphetamine-dextroamphetamine (ADDERALL) 10 MG tablet TAKE 1&1/2 TABLETS BY MOUTH ONCE A DAY AS NEEDED IN THE AFTERNOON  . [DISCONTINUED] amphetamine-dextroamphetamine (ADDERALL) 10 MG tablet TAKE 1&1/2 TABLETS BY MOUTH ONCE A DAY AS NEEDED IN THE AFTERNOON  . [DISCONTINUED] amphetamine-dextroamphetamine (ADDERALL) 10 MG tablet TAKE 1 AND 1/2 TABLETS BY MOUTH DAILY AS NEEDED IN THE AFTERNOON  . [DISCONTINUED] amphetamine-dextroamphetamine (ADDERALL) 20 MG tablet TAKE 1/2 TABLET BY MOUTH EVERY MORNING AND 1 TABLET DAILY IN THE AFTERNOON  . [DISCONTINUED] FLUoxetine (PROZAC) 20 MG capsule TAKE 3 CAPSULES BY MOUTH DAILY  . [DISCONTINUED] ibuprofen (ADVIL,MOTRIN) 200 MG tablet Take 400 mg every 6 (six) hours as needed by mouth for headache.  . [DISCONTINUED] modafinil (PROVIGIL) 200 MG tablet TAKE 1/2 TABLET BY MOUTH IN THE MORNING IF TOLERATED WELL AND NEED THE ADDITIONAL 1/2 TABLET MAY INCREASE TO 1 WHOLE TABLET DAILY IN THE MORNING   No facility-administered encounter medications on file as of 01/23/2021.    Follow-up: Return in about 2 weeks (around 02/06/2021) for Physical Exam.   Noreene Larsson, NP

## 2021-01-23 NOTE — Assessment & Plan Note (Signed)
-  states he had tick bite and had fatigue after the tick bite -will check lyme titer as well as TSH, Vit D

## 2021-02-06 ENCOUNTER — Ambulatory Visit (INDEPENDENT_AMBULATORY_CARE_PROVIDER_SITE_OTHER): Payer: No Typology Code available for payment source | Admitting: Nurse Practitioner

## 2021-02-06 ENCOUNTER — Encounter: Payer: Self-pay | Admitting: Nurse Practitioner

## 2021-02-06 ENCOUNTER — Other Ambulatory Visit: Payer: Self-pay

## 2021-02-06 ENCOUNTER — Other Ambulatory Visit (HOSPITAL_COMMUNITY): Payer: Self-pay

## 2021-02-06 VITALS — BP 174/102 | HR 78 | Temp 99.4°F | Resp 20 | Ht 74.0 in | Wt 227.0 lb

## 2021-02-06 DIAGNOSIS — W57XXXA Bitten or stung by nonvenomous insect and other nonvenomous arthropods, initial encounter: Secondary | ICD-10-CM | POA: Diagnosis not present

## 2021-02-06 DIAGNOSIS — F908 Attention-deficit hyperactivity disorder, other type: Secondary | ICD-10-CM | POA: Diagnosis not present

## 2021-02-06 DIAGNOSIS — I10 Essential (primary) hypertension: Secondary | ICD-10-CM | POA: Diagnosis not present

## 2021-02-06 DIAGNOSIS — R5383 Other fatigue: Secondary | ICD-10-CM | POA: Diagnosis not present

## 2021-02-06 DIAGNOSIS — G4733 Obstructive sleep apnea (adult) (pediatric): Secondary | ICD-10-CM

## 2021-02-06 MED ORDER — AMPHETAMINE-DEXTROAMPHETAMINE 20 MG PO TABS
ORAL_TABLET | ORAL | 0 refills | Status: DC
  Filled 2021-02-06: qty 45, 30d supply, fill #0

## 2021-02-06 MED ORDER — LOSARTAN POTASSIUM 50 MG PO TABS
50.0000 mg | ORAL_TABLET | Freq: Every day | ORAL | 1 refills | Status: DC
  Filled 2021-02-06: qty 90, 90d supply, fill #0

## 2021-02-06 NOTE — Assessment & Plan Note (Signed)
BP Readings from Last 3 Encounters:  02/06/21 (!) 174/102  01/23/21 (!) 162/97  04/25/20 140/90  -stopped amlodipine previously d/t leg swelling -stop olmesartan d/t side effects -Rx. Losartan -recheck in 1 month

## 2021-02-06 NOTE — Progress Notes (Signed)
Established Patient Office Visit  Subjective:  Patient ID: Alejandro Johnson, male    DOB: 04-Nov-1980  Age: 40 y.o. MRN: [id number]  CC:  Chief Complaint  Patient presents with  . Annual Exam    HPI Alejandro Johnson presents for physical exam. He didn't have fasting labs drawn prior to his visit. We were checking labs for fatigue- Vit D, TSH, and Lyme (had recent tick bite).  At his last OV, we stopped amlodipine d/t leg swelling, and we started him on amlodipine. He took olmesartan for 1 week, and he had dizziness and felt like he might throw up. His home BP readings have been elevated.  He is using the CPAP machine, so OSA not likely to be an issues.  Past Medical History:  Diagnosis Date  . ADHD   . Allergy    seasonal  . Anxiety   . Chronic prostatitis 07/08/2008   Qualifier: Diagnosis of  By: Sherren Mocha MD, Jory Ee   . Depression   . Depression    Phreesia 01/23/2021  . History of vasectomy 04/12/2019   [facility]  . Hypertension    Phreesia 01/23/2021    Past Surgical History:  Procedure Laterality Date  . VASECTOMY      Family History  Problem Relation Age of Onset  . Mental illness Mother   . Hypertension Mother   . Arthritis Father   . Mental illness Maternal Grandmother     Social History   Socioeconomic History  . Marital status: Married    Spouse name: [name]  . Number of children: 3  . Years of education: Not on file  . Highest education level: Not on file  Occupational History  . Occupation: Stay at [employer] Use  . Smoking status: Former Smoker    Packs/day: 1.00    Years: 10.00    Pack years: 10.00    Types: Cigarettes    Quit date: 2007    Years since quitting: 15.4  . Smokeless tobacco: Never Used  Vaping Use  . Vaping Use: Never used  Substance and Sexual Activity  . Alcohol use: Not Currently  . Drug use: No  . Sexual activity: Yes    Birth control/protection: Surgical  Other Topics Concern  . Not on file  Social History  Narrative   Married.  Lives with wife and 3 children.     Social Determinants of Health   Financial Resource Strain: Not on file  Food Insecurity: Not on file  Transportation Needs: Not on file  Physical Activity: Not on file  Stress: Not on file  Social Connections: Not on file  Intimate Partner Violence: Not on file    Outpatient Medications Prior to Visit  Medication Sig Dispense Refill  . Amphet-Dextroamphet 3-Bead ER (MYDAYIS) 50 MG CP24 Take 1 capsule by mouth daily 30 capsule 0  . amphetamine-dextroamphetamine (ADDERALL) 10 MG tablet Take a half tablet in the morning and a whole tablet in the afternoon around 1 pm. 45 tablet 0  . loratadine (CLARITIN) 10 MG tablet Take 10 mg daily by mouth.    . olmesartan (BENICAR) 20 MG tablet Take 1 tablet (20 mg total) by mouth daily. 30 tablet 1  . MYDAYIS 50 MG CP24 TAKE 1 CAPSULE BY MOUTH ONCE DAILY. FILL IN 60 DAYS 30 capsule 0   No facility-administered medications prior to visit.    Allergies  Allergen Reactions  . Codeine Itching and Rash    ROS Review of Systems  Constitutional: Negative.   HENT: Negative.   Eyes: Negative.   Respiratory: Negative.   Cardiovascular: Negative.   Gastrointestinal: Negative.   Endocrine: Negative.   Genitourinary: Negative.   Musculoskeletal: Negative.   Skin: Negative.   Allergic/Immunologic: Negative.   Neurological: Negative.   Hematological: Negative.   Psychiatric/Behavioral: Negative.       Objective:    Physical Exam Constitutional:      Appearance: Normal appearance.  HENT:     Head: Normocephalic and atraumatic.     Right Ear: Tympanic membrane, ear canal and external ear normal.     Left Ear: Tympanic membrane, ear canal and external ear normal.     Nose: Nose normal.     Mouth/Throat:     Mouth: Mucous membranes are moist.     Pharynx: Oropharynx is clear.  Eyes:     Extraocular Movements: Extraocular movements intact.     Conjunctiva/sclera: Conjunctivae  normal.     Pupils: Pupils are equal, round, and reactive to light.  Cardiovascular:     Rate and Rhythm: Normal rate and regular rhythm.     Pulses: Normal pulses.     Heart sounds: Normal heart sounds.  Pulmonary:     Effort: Pulmonary effort is normal.     Breath sounds: Normal breath sounds.  Abdominal:     General: Abdomen is flat. Bowel sounds are normal.     Palpations: Abdomen is soft.  Musculoskeletal:        General: Normal range of motion.     Cervical back: Normal range of motion and neck supple.  Skin:    General: Skin is warm and dry.     Capillary Refill: Capillary refill takes less than 2 seconds.  Neurological:     General: No focal deficit present.     Mental Status: He is alert and oriented to person, place, and time.  Psychiatric:        Mood and Affect: Mood normal.        Behavior: Behavior normal.        Thought Content: Thought content normal.        Judgment: Judgment normal.     BP (!) 174/102   Pulse 78   Temp 99.4 F (37.4 C)   Resp 20   Ht 6\' 2"  (1.88 m)   Wt 227 lb (103 kg)   SpO2 96%   BMI 29.15 kg/m  Wt Readings from Last 3 Encounters:  02/06/21 227 lb (103 kg)  01/23/21 226 lb (102.5 kg)  04/25/20 219 lb (99.3 kg)     There are no preventive care reminders to display for this patient.  There are no preventive care reminders to display for this patient.  Lab Results  Component Value Date   TSH 1.020 10/26/2019   Lab Results  Component Value Date   WBC 7.3 11/23/2019   HGB 15.2 (A) 11/23/2019   HCT 45.7 (A) 11/23/2019   MCV 85.3 11/23/2019   PLT 333 02/17/2018   Lab Results  Component Value Date   NA 140 04/25/2020   K 4.1 04/25/2020   CO2 26 04/25/2020   GLUCOSE 85 04/25/2020   BUN 10 04/25/2020   CREATININE 1.16 04/25/2020   BILITOT 0.3 02/17/2018   ALKPHOS 59 02/17/2018   AST 22 02/17/2018   ALT 28 02/17/2018   PROT 7.5 02/17/2018   ALBUMIN 4.9 02/17/2018   CALCIUM 9.7 04/25/2020   ANIONGAP 9 07/28/2017    Lab Results  Component Value  Date   CHOL 213 (H) 02/17/2018   Lab Results  Component Value Date   HDL 51 02/17/2018   Lab Results  Component Value Date   LDLCALC 115 (H) 02/17/2018   Lab Results  Component Value Date   TRIG 236 (H) 02/17/2018   Lab Results  Component Value Date   CHOLHDL 4.2 02/17/2018   No results found for: HGBA1C    Assessment & Plan:   Problem List Items Addressed This Visit      Cardiovascular and Mediastinum   Accelerated hypertension    BP Readings from Last 3 Encounters:  02/06/21 (!) 174/102  01/23/21 (!) 162/97  04/25/20 140/90  -stopped amlodipine previously d/t leg swelling -stop olmesartan d/t side effects -Rx. Losartan -recheck in 1 month      Relevant Medications   losartan (COZAAR) 50 MG tablet     Respiratory   OSA (obstructive sleep apnea)    -wearing CPAP and doing well        Other   ADHD (attention deficit hyperactivity disorder)    -takes Mydayis and Adderall as prescribed by Amy Mindi Slicker, DO      Fatigue - Primary    -drew lyme titer, TSH, and Vit. D today      Relevant Orders   Lyme Disease Serology w/Reflex    Other Visit Diagnoses    Tick bite, unspecified site, initial encounter       Relevant Orders   Lyme Disease Serology w/Reflex      Meds ordered this encounter  Medications  . losartan (COZAAR) 50 MG tablet    Sig: Take 1 tablet (50 mg total) by mouth daily.    Dispense:  90 tablet    Refill:  1    Follow-up: Return in about 1 month (around 03/09/2021) for Med check (Losartan).    Noreene Larsson, NP

## 2021-02-06 NOTE — Assessment & Plan Note (Signed)
-  wearing CPAP and doing well

## 2021-02-06 NOTE — Assessment & Plan Note (Signed)
-  takes Mydayis and Adderall as prescribed by Amy Mindi Slicker, DO

## 2021-02-06 NOTE — Assessment & Plan Note (Signed)
-  drew lyme titer, TSH, and Vit. D today

## 2021-02-10 ENCOUNTER — Other Ambulatory Visit (HOSPITAL_COMMUNITY): Payer: Self-pay

## 2021-02-10 ENCOUNTER — Other Ambulatory Visit: Payer: Self-pay | Admitting: Nurse Practitioner

## 2021-02-10 MED ORDER — VITAMIN D (ERGOCALCIFEROL) 1.25 MG (50000 UNIT) PO CAPS
50000.0000 [IU] | ORAL_CAPSULE | ORAL | 0 refills | Status: DC
  Filled 2021-02-10: qty 12, 84d supply, fill #0

## 2021-02-10 NOTE — Progress Notes (Signed)
Take Vit D once per week.

## 2021-02-10 NOTE — Progress Notes (Signed)
Triglycerides are elevated, but you can add 3 grams of fish oil daily to get that down. Vit D is a little low, so I sent in a rx for Vit D.

## 2021-02-11 LAB — CBC WITH DIFFERENTIAL/PLATELET
Basophils Absolute: 0.1 10*3/uL (ref 0.0–0.2)
Basos: 1 %
EOS (ABSOLUTE): 0.2 10*3/uL (ref 0.0–0.4)
Eos: 4 %
Hematocrit: 40.3 % (ref 37.5–51.0)
Hemoglobin: 13.8 g/dL (ref 13.0–17.7)
Immature Grans (Abs): 0 10*3/uL (ref 0.0–0.1)
Immature Granulocytes: 0 %
Lymphocytes Absolute: 1.5 10*3/uL (ref 0.7–3.1)
Lymphs: 24 %
MCH: 28.6 pg (ref 26.6–33.0)
MCHC: 34.2 g/dL (ref 31.5–35.7)
MCV: 84 fL (ref 79–97)
Monocytes Absolute: 0.5 10*3/uL (ref 0.1–0.9)
Monocytes: 8 %
Neutrophils Absolute: 3.8 10*3/uL (ref 1.4–7.0)
Neutrophils: 63 %
Platelets: 383 10*3/uL (ref 150–450)
RBC: 4.82 x10E6/uL (ref 4.14–5.80)
RDW: 13.4 % (ref 11.6–15.4)
WBC: 6 10*3/uL (ref 3.4–10.8)

## 2021-02-11 LAB — CMP14+EGFR
ALT: 40 IU/L (ref 0–44)
AST: 23 IU/L (ref 0–40)
Albumin/Globulin Ratio: 1.6 (ref 1.2–2.2)
Albumin: 4.5 g/dL (ref 4.0–5.0)
Alkaline Phosphatase: 82 IU/L (ref 44–121)
BUN/Creatinine Ratio: 9 (ref 9–20)
BUN: 10 mg/dL (ref 6–24)
Bilirubin Total: 0.3 mg/dL (ref 0.0–1.2)
CO2: 27 mmol/L (ref 20–29)
Calcium: 9.6 mg/dL (ref 8.7–10.2)
Chloride: 100 mmol/L (ref 96–106)
Creatinine, Ser: 1.14 mg/dL (ref 0.76–1.27)
Globulin, Total: 2.8 g/dL (ref 1.5–4.5)
Glucose: 97 mg/dL (ref 65–99)
Potassium: 3.8 mmol/L (ref 3.5–5.2)
Sodium: 142 mmol/L (ref 134–144)
Total Protein: 7.3 g/dL (ref 6.0–8.5)
eGFR: 83 mL/min/{1.73_m2} (ref >59)

## 2021-02-11 LAB — TSH: TSH: 1.47 u[IU]/mL (ref 0.450–4.500)

## 2021-02-11 LAB — LIPID PANEL WITH LDL/HDL RATIO
Cholesterol, Total: 177 mg/dL (ref 100–199)
HDL: 40 mg/dL (ref >39)
LDL Chol Calc (NIH): 90 mg/dL (ref 0–99)
LDL/HDL Ratio: 2.3 ratio (ref 0.0–3.6)
Triglycerides: 280 mg/dL — ABNORMAL HIGH (ref 0–149)
VLDL Cholesterol Cal: 47 mg/dL — ABNORMAL HIGH (ref 5–40)

## 2021-02-11 LAB — LYME DISEASE SEROLOGY W/REFLEX: Lyme Total Antibody EIA: NEGATIVE

## 2021-02-11 LAB — VITAMIN D 25 HYDROXY (VIT D DEFICIENCY, FRACTURES): Vit D, 25-Hydroxy: 26 ng/mL — ABNORMAL LOW (ref 30.0–100.0)

## 2021-02-16 ENCOUNTER — Encounter: Payer: Self-pay | Admitting: *Deleted

## 2021-02-24 ENCOUNTER — Other Ambulatory Visit (HOSPITAL_BASED_OUTPATIENT_CLINIC_OR_DEPARTMENT_OTHER): Payer: Self-pay

## 2021-02-24 ENCOUNTER — Other Ambulatory Visit (HOSPITAL_COMMUNITY): Payer: Self-pay

## 2021-02-24 ENCOUNTER — Other Ambulatory Visit: Payer: Self-pay

## 2021-02-24 MED ORDER — MYDAYIS 50 MG PO CP24
ORAL_CAPSULE | ORAL | 0 refills | Status: DC
  Filled 2021-02-24: qty 30, 30d supply, fill #0

## 2021-02-24 MED ORDER — AMPHETAMINE-DEXTROAMPHETAMINE 20 MG PO TABS
ORAL_TABLET | ORAL | 0 refills | Status: DC
  Filled 2021-02-24: qty 45, 30d supply, fill #0

## 2021-02-25 ENCOUNTER — Other Ambulatory Visit: Payer: Self-pay

## 2021-02-25 ENCOUNTER — Other Ambulatory Visit (HOSPITAL_COMMUNITY): Payer: Self-pay

## 2021-03-06 ENCOUNTER — Other Ambulatory Visit (HOSPITAL_COMMUNITY): Payer: Self-pay

## 2021-03-06 MED ORDER — DULOXETINE HCL 30 MG PO CPEP
ORAL_CAPSULE | ORAL | 2 refills | Status: DC
  Filled 2021-03-06: qty 30, 30d supply, fill #0
  Filled 2021-04-05: qty 30, 30d supply, fill #1
  Filled 2021-05-05: qty 30, 30d supply, fill #2

## 2021-03-06 MED ORDER — AMPHETAMINE-DEXTROAMPHETAMINE 20 MG PO TABS
ORAL_TABLET | ORAL | 0 refills | Status: DC
  Filled 2021-03-10: qty 45, 30d supply, fill #0

## 2021-03-09 ENCOUNTER — Other Ambulatory Visit (HOSPITAL_COMMUNITY): Payer: Self-pay

## 2021-03-10 ENCOUNTER — Other Ambulatory Visit (HOSPITAL_COMMUNITY): Payer: Self-pay

## 2021-03-13 ENCOUNTER — Other Ambulatory Visit (HOSPITAL_COMMUNITY): Payer: Self-pay

## 2021-03-13 ENCOUNTER — Other Ambulatory Visit: Payer: Self-pay

## 2021-03-13 ENCOUNTER — Ambulatory Visit (INDEPENDENT_AMBULATORY_CARE_PROVIDER_SITE_OTHER): Payer: No Typology Code available for payment source | Admitting: Nurse Practitioner

## 2021-03-13 ENCOUNTER — Encounter: Payer: Self-pay | Admitting: Nurse Practitioner

## 2021-03-13 VITALS — BP 158/101 | HR 84 | Ht 74.0 in | Wt 222.0 lb

## 2021-03-13 DIAGNOSIS — I10 Essential (primary) hypertension: Secondary | ICD-10-CM | POA: Diagnosis not present

## 2021-03-13 DIAGNOSIS — M25561 Pain in right knee: Secondary | ICD-10-CM | POA: Insufficient documentation

## 2021-03-13 DIAGNOSIS — M25562 Pain in left knee: Secondary | ICD-10-CM | POA: Diagnosis not present

## 2021-03-13 MED ORDER — LOSARTAN POTASSIUM 100 MG PO TABS
100.0000 mg | ORAL_TABLET | Freq: Every day | ORAL | 1 refills | Status: DC
  Filled 2021-03-13: qty 90, 90d supply, fill #0
  Filled 2021-07-13: qty 90, 90d supply, fill #1

## 2021-03-13 MED ORDER — PREDNISONE 10 MG PO TABS
ORAL_TABLET | ORAL | 0 refills | Status: AC
  Filled 2021-03-13: qty 21, 6d supply, fill #0

## 2021-03-13 NOTE — Assessment & Plan Note (Signed)
BP Readings from Last 3 Encounters:  03/13/21 (!) 158/101  02/06/21 (!) 174/102  01/23/21 (!) 162/97  -stopped amlodipine previously d/t leg swelling -stop olmesartan d/t side effects -INCREASE Losartan -recheck in 1 month

## 2021-03-13 NOTE — Assessment & Plan Note (Addendum)
-  pain is to medial aspect of both knees; no joint instability -Rx. Prednisone -he has been taking tylenol without relief -avoiding NSAIDs for now d/t BP issues -if no improvement, may consider ortho referral

## 2021-03-13 NOTE — Progress Notes (Signed)
Acute Office Visit  Subjective:    Patient ID: Alejandro Johnson, male    DOB: September 13, 1981, 40 y.o.   MRN: [id number]  Chief Complaint  Patient presents with   Hypertension    Follow up   Knee Pain    Bilateral knees, ongoing x3 weeks    Hypertension  Knee Pain   Patient is in today for BP check and bilateral knee pain.  At his last OV on 5/27, he had BP 174/102, so he was started on losartan.  Denies adverse med effects.  He states he started exercising after our last appointment, and hurt both of his knees while exercising.  He states he runs at his house, and he has knee pain when he is running at home. It feels better with rest, but pain is 5/10 today, but was 9/10 at one point.  He states he has been taking tylenol.  Past Medical History:  Diagnosis Date   ADHD    Allergy    seasonal   Anxiety    Chronic prostatitis 07/08/2008   Qualifier: Diagnosis of  By: Sherren Mocha MD, Jory Ee    Depression    Depression    Phreesia 01/23/2021   History of vasectomy 04/12/2019   [facility]   Hypertension    Phreesia 01/23/2021    Past Surgical History:  Procedure Laterality Date   VASECTOMY      Family History  Problem Relation Age of Onset   Mental illness Mother    Hypertension Mother    Arthritis Father    Mental illness Maternal Grandmother     Social History   Socioeconomic History   Marital status: Married    Spouse name: [name]   Number of children: 3   Years of education: Not on file   Highest education level: Not on file  Occupational History   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  Lives with wife and 3 children.     Social Determinants of Health   Financial Resource Strain: Not on file  Food Insecurity: Not on file  Transportation Needs: Not on file  Physical Activity: Not on file  Stress: Not on file  Social Connections: Not on file  Intimate Partner Violence: Not on file    Outpatient Medications Prior to Visit  Medication Sig Dispense Refill   Amphet-Dextroamphet 3-Bead ER (MYDAYIS) 50 MG CP24 Take 1 capsule by mouth daily 30 capsule 0   amphetamine-dextroamphetamine (ADDERALL) 20 MG tablet Take 1.5 tablets by mouth daily; half in am and one in afternoon 45 tablet 0   amphetamine-dextroamphetamine (ADDERALL) 20 MG tablet Take 1/2 tablet by mouth in the morning and 1 tablet by mouth in the afternoon 45 tablet 0   DULoxetine (CYMBALTA) 30 MG capsule Take 1 capsule by mouth daily 30 capsule 2   loratadine (CLARITIN) 10 MG tablet Take 10 mg daily by mouth.     losartan (COZAAR) 50  MG tablet Take 1 tablet (50 mg total) by mouth daily. 90 tablet 1   amphetamine-dextroamphetamine (ADDERALL) 10 MG tablet Take a half tablet in the morning and a whole tablet in the afternoon around 1 pm. 45 tablet 0   Vitamin D, Ergocalciferol, (DRISDOL) 1.25 MG (50000 UNIT) CAPS capsule Take 1 capsule (50,000 Units total) by mouth every 7 (seven) days. (Patient not taking: Reported on 03/13/2021) 12 capsule 0   No facility-administered medications prior to visit.    Allergies  Allergen Reactions   Codeine Itching and Rash    Review of Systems  Constitutional: Negative.   Respiratory: Negative.    Cardiovascular: Negative.   Musculoskeletal:  Positive for arthralgias.       Bilateral knee pain  Psychiatric/Behavioral: Negative.        Objective:    Physical Exam Constitutional:      Appearance: Normal appearance.  Cardiovascular:     Rate and Rhythm: Normal rate and regular rhythm.     Pulses: Normal pulses.      Heart sounds: Normal heart sounds.  Pulmonary:     Effort: Pulmonary effort is normal.     Breath sounds: Normal breath sounds.  Musculoskeletal:        General: Tenderness present. No swelling, deformity or signs of injury.     Comments: Bilateral knee tenderness  Neurological:     Mental Status: He is alert.  Psychiatric:        Mood and Affect: Mood normal.        Behavior: Behavior normal.        Thought Content: Thought content normal.        Judgment: Judgment normal.    BP (!) 158/101 (BP Location: Left Arm, Patient Position: Sitting, Cuff Size: Large)   Pulse 84   Ht 6' 2" (1.88 m)   Wt 222 lb (100.7 kg)   SpO2 96%   BMI 28.50 kg/m  Wt Readings from Last 3 Encounters:  03/13/21 222 lb (100.7 kg)  02/06/21 227 lb (103 kg)  01/23/21 226 lb (102.5 kg)    There are no preventive care reminders to display for this patient.  There are no preventive care reminders to display for this patient.   Lab Results  Component Value Date   TSH 1.470 02/06/2021   Lab Results  Component Value Date   WBC 6.0 02/06/2021   HGB 13.8 02/06/2021   HCT 40.3 02/06/2021   MCV 84 02/06/2021   PLT 383 02/06/2021   Lab Results  Component Value Date   NA 142 02/06/2021   K 3.8 02/06/2021   CO2 27 02/06/2021   GLUCOSE 97 02/06/2021   BUN 10 02/06/2021   CREATININE 1.14 02/06/2021   BILITOT 0.3 02/06/2021   ALKPHOS 82 02/06/2021   AST 23 02/06/2021   ALT 40 02/06/2021   PROT 7.3 02/06/2021   ALBUMIN 4.5 02/06/2021   CALCIUM 9.6 02/06/2021   ANIONGAP 9 07/28/2017   EGFR 83 02/06/2021   Lab Results  Component Value Date   CHOL 177 02/06/2021   Lab Results  Component Value Date   HDL 40 02/06/2021   Lab Results  Component Value Date   LDLCALC 90 02/06/2021   Lab Results  Component Value Date   TRIG 280 (H) 02/06/2021   Lab Results  Component Value Date   CHOLHDL 4.2 02/17/2018   No results found for: HGBA1C     Assessment & Plan:   Problem List Items  Addressed This  Visit       Cardiovascular and Mediastinum   Accelerated hypertension - Primary    BP Readings from Last 3 Encounters:  03/13/21 (!) 158/101  02/06/21 (!) 174/102  01/23/21 (!) 162/97  -stopped amlodipine previously d/t leg swelling -stop olmesartan d/t side effects -INCREASE Losartan -recheck in 1 month       Relevant Medications   losartan (COZAAR) 100 MG tablet     Other   Acute pain of both knees    -pain is to medial aspect of both knees; no joint instability -Rx. Prednisone -he has been taking tylenol without relief -avoiding NSAIDs for now d/t BP issues -if no improvement, may consider ortho referral       Relevant Medications   predniSONE (DELTASONE) 10 MG tablet     Meds ordered this encounter  Medications   predniSONE (DELTASONE) 10 MG tablet    Sig: Take 6 tablets (60 mg total) by mouth daily with breakfast for 1 day, THEN 5 tablets (50 mg total) daily with breakfast for 1 day, THEN 4 tablets (40 mg total) daily with breakfast for 1 day, THEN 3 tablets (30 mg total) daily with breakfast for 1 day, THEN 2 tablets (20 mg total) daily with breakfast for 1 day, THEN 1 tablet (10 mg total) daily with breakfast for 1 day.    Dispense:  21 tablet    Refill:  0   losartan (COZAAR) 100 MG tablet    Sig: Take 1 tablet (100 mg total) by mouth daily.    Dispense:  90 tablet    Refill:  [place], NP

## 2021-03-24 ENCOUNTER — Other Ambulatory Visit (HOSPITAL_COMMUNITY): Payer: Self-pay

## 2021-03-24 MED ORDER — MYDAYIS 50 MG PO CP24
ORAL_CAPSULE | ORAL | 0 refills | Status: DC
  Filled 2021-03-24: qty 30, 30d supply, fill #0

## 2021-04-06 ENCOUNTER — Other Ambulatory Visit (HOSPITAL_COMMUNITY): Payer: Self-pay

## 2021-04-09 ENCOUNTER — Other Ambulatory Visit (HOSPITAL_COMMUNITY): Payer: Self-pay

## 2021-04-09 MED ORDER — AMPHETAMINE-DEXTROAMPHETAMINE 20 MG PO TABS
ORAL_TABLET | ORAL | 0 refills | Status: DC
  Filled 2021-04-09: qty 45, 30d supply, fill #0

## 2021-04-10 ENCOUNTER — Other Ambulatory Visit (HOSPITAL_COMMUNITY): Payer: Self-pay

## 2021-04-10 ENCOUNTER — Encounter: Payer: Self-pay | Admitting: Nurse Practitioner

## 2021-04-10 ENCOUNTER — Ambulatory Visit (INDEPENDENT_AMBULATORY_CARE_PROVIDER_SITE_OTHER): Payer: No Typology Code available for payment source | Admitting: Nurse Practitioner

## 2021-04-10 ENCOUNTER — Other Ambulatory Visit: Payer: Self-pay

## 2021-04-10 VITALS — BP 143/97 | HR 90 | Temp 97.6°F | Ht 74.0 in | Wt 223.0 lb

## 2021-04-10 DIAGNOSIS — E781 Pure hyperglyceridemia: Secondary | ICD-10-CM | POA: Insufficient documentation

## 2021-04-10 DIAGNOSIS — I10 Essential (primary) hypertension: Secondary | ICD-10-CM

## 2021-04-10 MED ORDER — HYDROCHLOROTHIAZIDE 12.5 MG PO TABS
12.5000 mg | ORAL_TABLET | Freq: Every day | ORAL | 3 refills | Status: DC
  Filled 2021-04-10: qty 90, 90d supply, fill #0

## 2021-04-10 NOTE — Assessment & Plan Note (Signed)
Lab Results  Component Value Date   CHOL 177 02/06/2021   HDL 40 02/06/2021   LDLCALC 90 02/06/2021   TRIG 280 (H) 02/06/2021   CHOLHDL 4.2 02/17/2018  -recheck with next labs

## 2021-04-10 NOTE — Assessment & Plan Note (Addendum)
BP Readings from Last 3 Encounters:  04/10/21 (!) 143/97  03/13/21 (!) 158/101  02/06/21 (!) 174/102   -BP still elevated despite increasing losartan -Rx. HCTZ in addition to his losartan

## 2021-04-10 NOTE — Patient Instructions (Signed)
Please have fasting labs drawn 2-3 days prior to your appointment so we can discuss the results during your office visit.  

## 2021-04-10 NOTE — Progress Notes (Signed)
Acute Office Visit  Subjective:    Patient ID: Alejandro Johnson, male    DOB: 1981-08-19, 40 y.o.   MRN: [id number]  Chief Complaint  Patient presents with   Hypertension    Follow up    Hypertension  Patient is in today for BP check. At his last OV, we increased his losartan to 100 mg for BP 158/101.  He had taken olmesartan in the past, but he had side effects. He also took amlodipine in the past and had leg swelling.  Past Medical History:  Diagnosis Date   ADHD    Allergy    seasonal   Anxiety    Chronic prostatitis 07/08/2008   Qualifier: Diagnosis of  By: Sherren Mocha MD, Jory Ee    Depression    Depression    Phreesia 01/23/2021   History of vasectomy 04/12/2019   [facility]   Hypertension    Phreesia 01/23/2021    Past Surgical History:  Procedure Laterality Date   VASECTOMY      Family History  Problem Relation Age of Onset   Mental illness Mother    Hypertension Mother    Arthritis Father    Mental illness Maternal Grandmother     Social History   Socioeconomic History   Marital status: Married    Spouse name: [name]   Number of children: 3   Years of education: Not on file   Highest education level: Not on file  Occupational History   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  Lives with wife and 3 children.     Social Determinants of Health   Financial Resource Strain: Not on file  Food Insecurity: Not on file  Transportation Needs: Not on file  Physical Activity: Not on file  Stress: Not on file  Social  Connections: Not on file  Intimate Partner Violence: Not on file    Outpatient Medications Prior to Visit  Medication Sig Dispense Refill   Amphet-Dextroamphet 3-Bead ER (MYDAYIS) 50 MG CP24 Take 1 capsule by mouth daily 30 capsule 0   amphetamine-dextroamphetamine (ADDERALL) 20 MG tablet Take 1.5 tablets by mouth daily; half in am and one in afternoon 45 tablet 0   amphetamine-dextroamphetamine (ADDERALL) 20 MG tablet Take 1/2 tablet by mouth in the morning and 1 tablet by mouth in the afternoon 45 tablet 0   amphetamine-dextroamphetamine (ADDERALL) 20 MG tablet Take 1/2 tablet by mouth in the morning and 1 tablet in the afternoon. 45 tablet 0   DULoxetine (CYMBALTA) 30 MG capsule Take 1 capsule by mouth daily 30 capsule 2   loratadine (CLARITIN) 10 MG tablet Take 10 mg daily by mouth.     losartan (COZAAR) 100 MG tablet Take 1 tablet (100 mg total) by mouth daily. 90 tablet 1   Vitamin D, Ergocalciferol, (DRISDOL) 1.25 MG (50000 UNIT) CAPS capsule Take 1 capsule (50,000 Units total) by mouth every 7 (seven) days. (Patient not taking:  Reported on 04/10/2021) 12 capsule 0   No facility-administered medications prior to visit.    Allergies  Allergen Reactions   Codeine Itching and Rash    Review of Systems  Constitutional: Negative.   Respiratory: Negative.    Cardiovascular: Negative.   Musculoskeletal: Negative.   Psychiatric/Behavioral: Negative.        Objective:    Physical Exam Constitutional:      Appearance: Normal appearance.  Cardiovascular:     Rate and Rhythm: Normal rate and regular rhythm.     Pulses: Normal pulses.     Heart sounds: Normal heart sounds.  Pulmonary:     Effort: Pulmonary effort is normal.     Breath sounds: Normal breath sounds.  Musculoskeletal:        General: Normal range of motion.  Neurological:     Mental Status: He is alert.  Psychiatric:        Mood and Affect: Mood normal.        Behavior: Behavior normal.        Thought Content:  Thought content normal.        Judgment: Judgment normal.    BP (!) 143/97 (BP Location: Left Arm, Patient Position: Sitting, Cuff Size: Large)   Pulse 90   Temp 97.6 F (36.4 C) (Temporal)   Ht $R'6\' 2"'wH$  (1.88 m)   Wt 223 lb (101.2 kg)   SpO2 97%   BMI 28.63 kg/m  Wt Readings from Last 3 Encounters:  04/10/21 223 lb (101.2 kg)  03/13/21 222 lb (100.7 kg)  02/06/21 227 lb (103 kg)    There are no preventive care reminders to display for this patient.  There are no preventive care reminders to display for this patient.   Lab Results  Component Value Date   TSH 1.470 02/06/2021   Lab Results  Component Value Date   WBC 6.0 02/06/2021   HGB 13.8 02/06/2021   HCT 40.3 02/06/2021   MCV 84 02/06/2021   PLT 383 02/06/2021   Lab Results  Component Value Date   NA 142 02/06/2021   K 3.8 02/06/2021   CO2 27 02/06/2021   GLUCOSE 97 02/06/2021   BUN 10 02/06/2021   CREATININE 1.14 02/06/2021   BILITOT 0.3 02/06/2021   ALKPHOS 82 02/06/2021   AST 23 02/06/2021   ALT 40 02/06/2021   PROT 7.3 02/06/2021   ALBUMIN 4.5 02/06/2021   CALCIUM 9.6 02/06/2021   ANIONGAP 9 07/28/2017   EGFR 83 02/06/2021   Lab Results  Component Value Date   CHOL 177 02/06/2021   Lab Results  Component Value Date   HDL 40 02/06/2021   Lab Results  Component Value Date   LDLCALC 90 02/06/2021   Lab Results  Component Value Date   TRIG 280 (H) 02/06/2021   Lab Results  Component Value Date   CHOLHDL 4.2 02/17/2018   No results found for: HGBA1C     Assessment & Plan:   Problem List Items Addressed This Visit       Cardiovascular and Mediastinum   Accelerated hypertension - Primary    BP Readings from Last 3 Encounters:  04/10/21 (!) 143/97  03/13/21 (!) 158/101  02/06/21 (!) 174/102  -BP still elevated despite increasing losartan -Rx. HCTZ in addition to his losartan      Relevant Medications   hydrochlorothiazide (HYDRODIURIL) 12.5 MG tablet   Other Relevant Orders    CBC with Differential/Platelet   CMP14+EGFR   Lipid Panel With LDL/HDL  Ratio     Other   Pure hypertriglyceridemia    Lab Results  Component Value Date   CHOL 177 02/06/2021   HDL 40 02/06/2021   LDLCALC 90 02/06/2021   TRIG 280 (H) 02/06/2021   CHOLHDL 4.2 02/17/2018  -recheck with next labs       Relevant Medications   hydrochlorothiazide (HYDRODIURIL) 12.5 MG tablet   Other Relevant Orders   Lipid Panel With LDL/HDL Ratio     Meds ordered this encounter  Medications   hydrochlorothiazide (HYDRODIURIL) 12.5 MG tablet    Sig: Take 1 tablet (12.5 mg total) by mouth daily.    Dispense:  90 tablet    Refill:  [place], NP

## 2021-04-22 ENCOUNTER — Other Ambulatory Visit: Payer: Self-pay | Admitting: Nurse Practitioner

## 2021-04-22 ENCOUNTER — Encounter: Payer: Self-pay | Admitting: Nurse Practitioner

## 2021-04-22 ENCOUNTER — Other Ambulatory Visit (HOSPITAL_COMMUNITY): Payer: Self-pay

## 2021-04-22 DIAGNOSIS — I10 Essential (primary) hypertension: Secondary | ICD-10-CM

## 2021-04-22 MED ORDER — CHLORTHALIDONE 25 MG PO TABS
12.5000 mg | ORAL_TABLET | Freq: Every day | ORAL | 1 refills | Status: DC
  Filled 2021-04-22: qty 45, 90d supply, fill #0

## 2021-04-23 ENCOUNTER — Other Ambulatory Visit (HOSPITAL_COMMUNITY): Payer: Self-pay

## 2021-04-23 ENCOUNTER — Other Ambulatory Visit: Payer: Self-pay | Admitting: Nurse Practitioner

## 2021-04-23 DIAGNOSIS — I10 Essential (primary) hypertension: Secondary | ICD-10-CM

## 2021-04-23 MED ORDER — SPIRONOLACTONE 25 MG PO TABS
25.0000 mg | ORAL_TABLET | Freq: Every day | ORAL | 3 refills | Status: DC
  Filled 2021-04-23: qty 90, 90d supply, fill #0
  Filled 2021-07-23: qty 90, 90d supply, fill #1
  Filled 2021-10-20: qty 90, 90d supply, fill #2

## 2021-04-24 ENCOUNTER — Other Ambulatory Visit (HOSPITAL_COMMUNITY): Payer: Self-pay

## 2021-04-24 MED ORDER — MYDAYIS 50 MG PO CP24
50.0000 mg | ORAL_CAPSULE | Freq: Every day | ORAL | 0 refills | Status: DC
  Filled 2021-04-24: qty 30, 30d supply, fill #0

## 2021-04-24 NOTE — Telephone Encounter (Signed)
Hey [place]! Can you call our office when you get a chance. Just ask for me personally!   Laretta Bolster, LPN

## 2021-04-25 ENCOUNTER — Other Ambulatory Visit (HOSPITAL_COMMUNITY): Payer: Self-pay

## 2021-05-05 ENCOUNTER — Other Ambulatory Visit (HOSPITAL_COMMUNITY): Payer: Self-pay

## 2021-05-15 ENCOUNTER — Other Ambulatory Visit (HOSPITAL_COMMUNITY): Payer: Self-pay

## 2021-05-15 MED ORDER — AMPHETAMINE-DEXTROAMPHETAMINE 20 MG PO TABS
ORAL_TABLET | ORAL | 0 refills | Status: DC
  Filled 2021-05-15: qty 45, 30d supply, fill #0

## 2021-05-22 ENCOUNTER — Other Ambulatory Visit: Payer: Self-pay

## 2021-05-22 ENCOUNTER — Ambulatory Visit (INDEPENDENT_AMBULATORY_CARE_PROVIDER_SITE_OTHER): Payer: No Typology Code available for payment source | Admitting: Nurse Practitioner

## 2021-05-22 ENCOUNTER — Encounter: Payer: Self-pay | Admitting: Nurse Practitioner

## 2021-05-22 VITALS — BP 133/87 | HR 96 | Ht 74.0 in | Wt 223.0 lb

## 2021-05-22 DIAGNOSIS — F908 Attention-deficit hyperactivity disorder, other type: Secondary | ICD-10-CM | POA: Diagnosis not present

## 2021-05-22 DIAGNOSIS — I10 Essential (primary) hypertension: Secondary | ICD-10-CM

## 2021-05-22 DIAGNOSIS — Z23 Encounter for immunization: Secondary | ICD-10-CM

## 2021-05-22 MED ORDER — DULOXETINE HCL 30 MG PO CPEP: 60.0000 mg | ORAL_CAPSULE | Freq: Every day | ORAL | 3 refills | Status: DC

## 2021-05-22 NOTE — Patient Instructions (Signed)
Please have fasting labs drawn 2-3 days prior to your appointment so we can discuss the results during your office visit.  

## 2021-05-22 NOTE — Progress Notes (Signed)
Established Patient Office Visit  Subjective:  Patient ID: Alejandro Johnson, male    DOB: 1980-11-07  Age: 40 y.o. MRN: [id number]  CC:  Chief Complaint  Patient presents with   Hypertension    Follow up    HPI Alejandro Denslow presents for BP follow-up.  Past Medical History:  Diagnosis Date   ADHD    Allergy    seasonal   Anxiety    Chronic prostatitis 07/08/2008   Qualifier: Diagnosis of  By: Sherren Mocha MD, Jory Ee    Depression    Depression    Phreesia 01/23/2021   History of vasectomy 04/12/2019   [facility]   Hypertension    Phreesia 01/23/2021    Past Surgical History:  Procedure Laterality Date   VASECTOMY      Family History  Problem Relation Age of Onset   Mental illness Mother    Hypertension Mother    Arthritis Father    Mental illness Maternal Grandmother     Social History   Socioeconomic History   Marital status: Married    Spouse name: [name]   Number of children: 3   Years of education: Not on file   Highest education level: Not on file  Occupational History   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  Lives with wife and 3 children.     Social Determinants of Health   Financial Resource Strain: Not on file  Food Insecurity: Not on file  Transportation Needs: Not on file  Physical Activity: Not on file  Stress: Not on file  Social Connections: Not on file  Intimate Partner Violence: Not on file    Outpatient Medications Prior to Visit  Medication Sig Dispense Refill   Amphet-Dextroamphet 3-Bead ER  (MYDAYIS) 50 MG CP24 Take 1 capsule (50 mg) by mouth daily. 30 capsule 0   amphetamine-dextroamphetamine (ADDERALL) 20 MG tablet Take 1.5 tablets by mouth daily; half in am and one in afternoon 45 tablet 0   amphetamine-dextroamphetamine (ADDERALL) 20 MG tablet Take 1/2 tablet by mouth in the morning and 1 tablet by mouth in the afternoon 45 tablet 0   amphetamine-dextroamphetamine (ADDERALL) 20 MG tablet Take 1/2 tablet by mouth in the morning and 1 tablet in the afternoon. 45 tablet 0   amphetamine-dextroamphetamine (ADDERALL) 20 MG tablet Take 1/2 tablet each morning and 1 tablet in the afternoon 45 tablet 0   loratadine (CLARITIN) 10 MG tablet Take 10 mg daily by mouth.     losartan (COZAAR) 100 MG tablet Take 1 tablet (100 mg total) by mouth daily. 90 tablet 1   spironolactone (ALDACTONE) 25 MG tablet Take 1 tablet (25 mg total) by mouth daily. 90 tablet 3   DULoxetine (CYMBALTA) 30 MG capsule Take 1 capsule by mouth daily (Patient taking differently: Take 60 mg by mouth.) 30 capsule 2   No facility-administered medications prior to visit.    Allergies  Allergen Reactions  Codeine Itching and Rash    ROS Review of Systems  Constitutional: Negative.   Respiratory: Negative.    Cardiovascular: Negative.   Musculoskeletal: Negative.   Psychiatric/Behavioral: Negative.       Objective:    Physical Exam Constitutional:      Appearance: Normal appearance.  Cardiovascular:     Rate and Rhythm: Normal rate and regular rhythm.     Pulses: Normal pulses.     Heart sounds: Normal heart sounds.  Pulmonary:     Effort: Pulmonary effort is normal.     Breath sounds: Normal breath sounds.  Musculoskeletal:        General: Normal range of motion.  Neurological:     Mental Status: He is alert.  Psychiatric:        Mood and Affect: Mood normal.        Behavior: Behavior normal.        Thought Content: Thought content normal.        Judgment: Judgment normal.    BP 133/87 (BP  Location: Left Arm, Patient Position: Sitting, Cuff Size: Large)   Pulse 96   Ht 6' 2"  (1.88 m)   Wt 223 lb (101.2 kg)   SpO2 96%   BMI 28.63 kg/m  Wt Readings from Last 3 Encounters:  05/22/21 223 lb (101.2 kg)  04/10/21 223 lb (101.2 kg)  03/13/21 222 lb (100.7 kg)     Health Maintenance Due  Topic Date Due   Hepatitis C Screening  Never done   INFLUENZA VACCINE  04/13/2021    There are no preventive care reminders to display for this patient.  Lab Results  Component Value Date   TSH 1.470 02/06/2021   Lab Results  Component Value Date   WBC 6.0 02/06/2021   HGB 13.8 02/06/2021   HCT 40.3 02/06/2021   MCV 84 02/06/2021   PLT 383 02/06/2021   Lab Results  Component Value Date   NA 142 02/06/2021   K 3.8 02/06/2021   CO2 27 02/06/2021   GLUCOSE 97 02/06/2021   BUN 10 02/06/2021   CREATININE 1.14 02/06/2021   BILITOT 0.3 02/06/2021   ALKPHOS 82 02/06/2021   AST 23 02/06/2021   ALT 40 02/06/2021   PROT 7.3 02/06/2021   ALBUMIN 4.5 02/06/2021   CALCIUM 9.6 02/06/2021   ANIONGAP 9 07/28/2017   EGFR 83 02/06/2021   Lab Results  Component Value Date   CHOL 177 02/06/2021   Lab Results  Component Value Date   HDL 40 02/06/2021   Lab Results  Component Value Date   LDLCALC 90 02/06/2021   Lab Results  Component Value Date   TRIG 280 (H) 02/06/2021   Lab Results  Component Value Date   CHOLHDL 4.2 02/17/2018   No results found for: HGBA1C    Assessment & Plan:   Problem List Items Addressed This Visit       Cardiovascular and Mediastinum   Accelerated hypertension - Primary    BP Readings from Last 3 Encounters:  05/22/21 133/87  04/10/21 (!) 143/97  03/13/21 (!) 158/101  -BP doing well with losartan and spironolactone -check BMP today      Relevant Orders   Basic metabolic panel   EVO35+KKXF   CBC with Differential/Platelet   Lipid Panel With LDL/HDL Ratio     Other   ADHD (attention deficit hyperactivity disorder)    -takes  Mydayis and Adderall as prescribed by Charlynn Grimes, DO -had recent med change with  psych, updated duloxetine dose info       Meds ordered this encounter  Medications   DULoxetine (CYMBALTA) 30 MG capsule    Sig: Take 2 capsules (60 mg total) by mouth daily.    Dispense:  90 capsule    Refill:  3    Follow-up: Return in about 6 months (around 11/19/2021) for Lab follow-up (HTN).    Noreene Larsson, NP

## 2021-05-22 NOTE — Assessment & Plan Note (Signed)
-  takes Mydayis and Adderall as prescribed by Charlynn Grimes, DO -had recent med change with psych, updated duloxetine dose info

## 2021-05-22 NOTE — Assessment & Plan Note (Signed)
BP Readings from Last 3 Encounters:  05/22/21 133/87  04/10/21 (!) 143/97  03/13/21 (!) 158/101   -BP doing well with losartan and spironolactone -check BMP today

## 2021-05-23 LAB — BASIC METABOLIC PANEL
BUN/Creatinine Ratio: 11 (ref 9–20)
BUN: 14 mg/dL (ref 6–24)
CO2: 26 mmol/L (ref 20–29)
Calcium: 9.9 mg/dL (ref 8.7–10.2)
Chloride: 98 mmol/L (ref 96–106)
Creatinine, Ser: 1.22 mg/dL (ref 0.76–1.27)
Glucose: 94 mg/dL (ref 65–99)
Potassium: 4.5 mmol/L (ref 3.5–5.2)
Sodium: 140 mmol/L (ref 134–144)
eGFR: 76 mL/min/{1.73_m2} (ref >59)

## 2021-05-25 NOTE — Progress Notes (Signed)
Potassium looks great! No need to change meds.

## 2021-05-26 ENCOUNTER — Telehealth: Payer: No Typology Code available for payment source | Admitting: Adult Health

## 2021-05-27 ENCOUNTER — Other Ambulatory Visit (HOSPITAL_COMMUNITY): Payer: Self-pay

## 2021-05-27 MED ORDER — MYDAYIS 50 MG PO CP24
ORAL_CAPSULE | ORAL | 0 refills | Status: DC
  Filled 2021-05-27: qty 30, 30d supply, fill #0

## 2021-05-27 MED ORDER — DULOXETINE HCL 60 MG PO CPEP
ORAL_CAPSULE | ORAL | 5 refills | Status: DC
  Filled 2021-05-27: qty 30, 30d supply, fill #0

## 2021-05-27 MED ORDER — DULOXETINE HCL 30 MG PO CPEP
ORAL_CAPSULE | ORAL | 6 refills | Status: DC
  Filled 2021-05-27: qty 30, 30d supply, fill #0

## 2021-06-12 ENCOUNTER — Other Ambulatory Visit (HOSPITAL_COMMUNITY): Payer: Self-pay

## 2021-06-12 MED ORDER — AMPHETAMINE-DEXTROAMPHETAMINE 20 MG PO TABS
ORAL_TABLET | ORAL | 0 refills | Status: DC
  Filled 2021-06-12: qty 45, 30d supply, fill #0

## 2021-06-23 ENCOUNTER — Other Ambulatory Visit (HOSPITAL_COMMUNITY): Payer: Self-pay

## 2021-06-23 MED ORDER — MYDAYIS 50 MG PO CP24
ORAL_CAPSULE | ORAL | 0 refills | Status: DC
  Filled 2021-06-26: qty 30, 30d supply, fill #0

## 2021-06-26 ENCOUNTER — Other Ambulatory Visit (HOSPITAL_COMMUNITY): Payer: Self-pay

## 2021-07-13 ENCOUNTER — Other Ambulatory Visit (HOSPITAL_COMMUNITY): Payer: Self-pay

## 2021-07-20 ENCOUNTER — Other Ambulatory Visit (HOSPITAL_COMMUNITY): Payer: Self-pay

## 2021-07-20 MED ORDER — AMPHETAMINE-DEXTROAMPHETAMINE 20 MG PO TABS
ORAL_TABLET | ORAL | 0 refills | Status: DC
  Filled 2021-07-20: qty 45, 30d supply, fill #0

## 2021-07-23 ENCOUNTER — Other Ambulatory Visit (HOSPITAL_COMMUNITY): Payer: Self-pay

## 2021-07-23 MED ORDER — MYDAYIS 50 MG PO CP24
50.0000 mg | ORAL_CAPSULE | Freq: Every day | ORAL | 0 refills | Status: DC
  Filled 2021-07-23 – 2021-07-24 (×3): qty 30, 30d supply, fill #0

## 2021-07-24 ENCOUNTER — Other Ambulatory Visit (HOSPITAL_COMMUNITY): Payer: Self-pay

## 2021-08-14 ENCOUNTER — Other Ambulatory Visit (HOSPITAL_COMMUNITY): Payer: Self-pay

## 2021-08-14 MED ORDER — BUPROPION HCL ER (XL) 150 MG PO TB24
150.0000 mg | ORAL_TABLET | Freq: Every day | ORAL | 2 refills | Status: DC
  Filled 2021-08-14: qty 30, 30d supply, fill #0

## 2021-08-17 ENCOUNTER — Other Ambulatory Visit (HOSPITAL_COMMUNITY): Payer: Self-pay

## 2021-08-17 MED ORDER — AMPHETAMINE-DEXTROAMPHETAMINE 20 MG PO TABS
ORAL_TABLET | ORAL | 0 refills | Status: DC
  Filled 2021-08-17: qty 45, 30d supply, fill #0

## 2021-08-18 ENCOUNTER — Other Ambulatory Visit (HOSPITAL_COMMUNITY): Payer: Self-pay

## 2021-08-21 ENCOUNTER — Ambulatory Visit (INDEPENDENT_AMBULATORY_CARE_PROVIDER_SITE_OTHER): Payer: No Typology Code available for payment source | Admitting: Nurse Practitioner

## 2021-08-21 ENCOUNTER — Encounter: Payer: Self-pay | Admitting: Nurse Practitioner

## 2021-08-21 ENCOUNTER — Other Ambulatory Visit: Payer: Self-pay

## 2021-08-21 VITALS — BP 143/86 | HR 74 | Temp 99.0°F | Resp 16 | Ht 74.0 in | Wt 225.8 lb

## 2021-08-21 DIAGNOSIS — M722 Plantar fascial fibromatosis: Secondary | ICD-10-CM | POA: Insufficient documentation

## 2021-08-21 DIAGNOSIS — D179 Benign lipomatous neoplasm, unspecified: Secondary | ICD-10-CM | POA: Insufficient documentation

## 2021-08-21 DIAGNOSIS — I10 Essential (primary) hypertension: Secondary | ICD-10-CM

## 2021-08-21 DIAGNOSIS — Z0001 Encounter for general adult medical examination with abnormal findings: Secondary | ICD-10-CM | POA: Diagnosis not present

## 2021-08-21 NOTE — Assessment & Plan Note (Addendum)
-  has pain in midfoot that is worse while walking/running/exercising -handout provided -we discussed going to [employer] [facility] or [employer] to get gait analysis and consider shoe change since he his running frequently -if no improvement, will consider podiatry referral

## 2021-08-21 NOTE — Assessment & Plan Note (Signed)
-  he has multiple lipomas; has one on left arm, torso, and legs as well -we discussed that these are likely benign and no further investigation is required especially since he has had them for several years and they have not grown -he would still like a second opinion, and I am glad to honor that request -referral to derm

## 2021-08-21 NOTE — Assessment & Plan Note (Signed)
BP Readings from Last 3 Encounters:  08/21/21 (!) 143/86  05/22/21 133/87  04/10/21 (!) 143/97   -BP slightly elevated today -taking losartan -he states that home BPs have been consistently 130/70s

## 2021-08-21 NOTE — Patient Instructions (Addendum)
Please have fasting labs drawn 2-3 days prior to your appointment so we can discuss the results during your office visit.   You can try the [employer] store or [employer] to get gait analysis and be fitted for a great running shoe.  I will be moving to [facility] located at [address], [place], [place] [address] effective Sep 13, 2021. If you would like to establish care with [facility] please call [phone], otherwise the great team at [facility] will continue to take excellent care of you.

## 2021-08-21 NOTE — Progress Notes (Signed)
Acute Office Visit  Subjective:    Patient ID: Alejandro Johnson, male    DOB: 1981/07/08, 40 y.o.   MRN: [id number]  Chief Complaint  Patient presents with   Hypertension    Follow up visit     Hypertension  Patient is in today for HTN follow-up. BP has been 130/70s at home, and he states he has been feeling nervous today.  He is interested with referral to dermatology for several suspected lipomas that have been present for at least 2 years. He states he had a previous appointment with [place], but cancelled this when the [place] outbreak occurred.  Past Medical History:  Diagnosis Date   ADHD    Allergy    seasonal   Anxiety    Chronic prostatitis 07/08/2008   Qualifier: Diagnosis of  By: Sherren Mocha MD, Jory Ee    Depression    Depression    Phreesia 01/23/2021   History of vasectomy 04/12/2019   [facility]   Hypertension    Phreesia 01/23/2021    Past Surgical History:  Procedure Laterality Date   VASECTOMY      Family History  Problem Relation Age of Onset   Mental illness Mother    Hypertension Mother    Arthritis Father    Mental illness Maternal Grandmother     Social History   Socioeconomic History   Marital status: Married    Spouse name: [name]   Number of children: 3   Years of education: Not on file   Highest education level: Not on file  Occupational History   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  Lives with wife and 3 children.     Social Determinants of Health   Financial Resource Strain: Not on file   Food Insecurity: Not on file  Transportation Needs: Not on file  Physical Activity: Not on file  Stress: Not on file  Social Connections: Not on file  Intimate Partner Violence: Not on file    Outpatient Medications Prior to Visit  Medication Sig Dispense Refill   Amphet-Dextroamphet 3-Bead ER (MYDAYIS) 50 MG CP24 Take 1 capsule by mouth daily. 30 capsule 0   amphetamine-dextroamphetamine (ADDERALL) 20 MG tablet Take 1/2 tablet by mouth every morning and 1 tablet in the afternoon 45 tablet 0   buPROPion (WELLBUTRIN XL) 150 MG 24 hr tablet Take 1 tablet by mouth daily 30 tablet 2   loratadine (CLARITIN) 10 MG tablet Take 10 mg daily by mouth.     losartan (COZAAR) 100 MG tablet Take 1 tablet (100 mg total) by mouth daily. 90 tablet 1   spironolactone (ALDACTONE) 25 MG tablet Take 1 tablet (25 mg total) by mouth daily. 90 tablet 3   amphetamine-dextroamphetamine (ADDERALL) 20 MG tablet Take 1.5 tablets by mouth daily; half in am and one in afternoon 45 tablet 0   amphetamine-dextroamphetamine (ADDERALL) 20 MG tablet Take 1/2 tablet by mouth in the  morning and 1 tablet by mouth in the afternoon 45 tablet 0   amphetamine-dextroamphetamine (ADDERALL) 20 MG tablet Take 1/2 tablet by mouth in the morning and 1 tablet in the afternoon. 45 tablet 0   amphetamine-dextroamphetamine (ADDERALL) 20 MG tablet Take 1/2 tablet each morning and 1 tablet in the afternoon 45 tablet 0   amphetamine-dextroamphetamine (ADDERALL) 20 MG tablet Take 1/2 tablet by mouth every morning and 1 tablet in the afternoon 45 tablet 0   amphetamine-dextroamphetamine (ADDERALL) 20 MG tablet Take 1/2 tablet by mouth in the morning and 1 tablet by mouth in the afternoon 45 tablet 0   DULoxetine (CYMBALTA) 30 MG capsule Take 2 capsules (60 mg total) by mouth daily. 90 capsule 3   DULoxetine (CYMBALTA) 60 MG capsule Take 1 capsule by mouth daily 30 capsule 5   No facility-administered medications prior to visit.    Allergies   Allergen Reactions   Codeine Itching and Rash    Review of Systems  Constitutional: Negative.   Respiratory: Negative.    Cardiovascular: Negative.   Musculoskeletal:        Foot pain with exercise  Skin:        Multiple tumors in his skin      Objective:    Physical Exam Constitutional:      Appearance: Normal appearance.  Cardiovascular:     Rate and Rhythm: Normal rate and regular rhythm.     Pulses: Normal pulses.     Heart sounds: Normal heart sounds.  Pulmonary:     Effort: Pulmonary effort is normal.     Breath sounds: Normal breath sounds.  Skin:    Comments: Multiple lipomas palpated  Neurological:     Mental Status: He is alert.  Psychiatric:        Mood and Affect: Mood normal.        Behavior: Behavior normal.        Thought Content: Thought content normal.        Judgment: Judgment normal.    BP (!) 143/86   Pulse 74   Temp 99 F (37.2 C) (Oral)   Resp 16   Ht _0  (1.88 m)   Wt 225 lb 12.8 oz (102.4 kg)   SpO2 97%   BMI 28.99 kg/m  Wt Readings from Last 3 Encounters:  08/21/21 225 lb 12.8 oz (102.4 kg)  05/22/21 223 lb (101.2 kg)  04/10/21 223 lb (101.2 kg)    Health Maintenance Due  Topic Date Due   Hepatitis C Screening  Never done    There are no preventive care reminders to display for this patient.   Lab Results  Component Value Date   TSH 1.470 02/06/2021   Lab Results  Component Value Date   WBC 6.0 02/06/2021   HGB 13.8 02/06/2021   HCT 40.3 02/06/2021   MCV 84 02/06/2021   PLT 383 02/06/2021   Lab Results  Component Value Date   NA 140 05/22/2021   K 4.5 05/22/2021   CO2 26 05/22/2021   GLUCOSE 94 05/22/2021   BUN 14 05/22/2021   CREATININE 1.22 05/22/2021   BILITOT 0.3 02/06/2021   ALKPHOS 82 02/06/2021   AST 23 02/06/2021   ALT 40 02/06/2021   PROT 7.3 02/06/2021   ALBUMIN 4.5 02/06/2021   CALCIUM 9.9 05/22/2021   ANIONGAP 9 07/28/2017   EGFR 76 05/22/2021   Lab Results  Component Value Date    CHOL 177 02/06/2021   Lab Results  Component  Value Date   HDL 40 02/06/2021   Lab Results  Component Value Date   LDLCALC 90 02/06/2021   Lab Results  Component Value Date   TRIG 280 (H) 02/06/2021   Lab Results  Component Value Date   CHOLHDL 4.2 02/17/2018   No results found for: HGBA1C     Assessment & Plan:   Problem List Items Addressed This Visit       Cardiovascular and Mediastinum   Accelerated hypertension    BP Readings from Last 3 Encounters:  08/21/21 (!) 143/86  05/22/21 133/87  04/10/21 (!) 143/97  -BP slightly elevated today -taking losartan -he states that home BPs have been consistently 130/70s        Musculoskeletal and Integument   Plantar fasciitis    -has pain in midfoot that is worse while walking/running/exercising -handout provided -we discussed going to [employer] store or [employer] to get gait analysis and consider shoe change since he his running frequently -if no improvement, will consider podiatry referral        Other   Lipoma    -he has multiple lipomas; has one on left arm, torso, and legs as well -we discussed that these are likely benign and no further investigation is required especially since he has had them for several years and they have not grown -he would still like a second opinion, and I am glad to honor that request -referral to derm      Relevant Orders   Ambulatory referral to Dermatology   Other Visit Diagnoses     Encounter for general adult medical examination with abnormal findings    -  Primary   Relevant Orders   CBC with Differential/Platelet   CMP14+EGFR   Lipid Panel With LDL/HDL Ratio        No orders of the defined types were placed in this encounter.    Noreene Larsson, NP

## 2021-08-27 ENCOUNTER — Other Ambulatory Visit (HOSPITAL_COMMUNITY): Payer: Self-pay

## 2021-08-27 MED ORDER — MYDAYIS 50 MG PO CP24
50.0000 mg | ORAL_CAPSULE | Freq: Every day | ORAL | 0 refills | Status: DC
  Filled 2021-08-27: qty 30, 30d supply, fill #0

## 2021-09-09 ENCOUNTER — Other Ambulatory Visit (HOSPITAL_COMMUNITY): Payer: Self-pay

## 2021-09-09 MED ORDER — BUPROPION HCL ER (XL) 300 MG PO TB24
300.0000 mg | ORAL_TABLET | Freq: Every day | ORAL | 6 refills | Status: DC
  Filled 2021-09-09: qty 30, 30d supply, fill #0

## 2021-09-18 ENCOUNTER — Other Ambulatory Visit (HOSPITAL_COMMUNITY): Payer: Self-pay

## 2021-09-18 MED ORDER — AMPHETAMINE-DEXTROAMPHETAMINE 20 MG PO TABS
ORAL_TABLET | ORAL | 0 refills | Status: DC
  Filled 2021-09-18: qty 45, 30d supply, fill #0

## 2021-09-21 ENCOUNTER — Other Ambulatory Visit (HOSPITAL_COMMUNITY): Payer: Self-pay

## 2021-09-21 MED ORDER — GUANFACINE HCL 1 MG PO TABS
ORAL_TABLET | ORAL | 0 refills | Status: DC
  Filled 2021-09-21: qty 30, 30d supply, fill #0

## 2021-09-25 ENCOUNTER — Other Ambulatory Visit (HOSPITAL_COMMUNITY): Payer: Self-pay

## 2021-09-25 MED ORDER — MYDAYIS 50 MG PO CP24
ORAL_CAPSULE | ORAL | 0 refills | Status: DC
  Filled 2021-09-25: qty 30, 30d supply, fill #0

## 2021-10-06 ENCOUNTER — Other Ambulatory Visit (HOSPITAL_COMMUNITY): Payer: Self-pay

## 2021-10-06 MED ORDER — FLUOXETINE HCL 20 MG PO CAPS
ORAL_CAPSULE | ORAL | 1 refills | Status: DC
  Filled 2021-10-06: qty 30, 30d supply, fill #0
  Filled 2021-11-06: qty 30, 30d supply, fill #1

## 2021-10-11 ENCOUNTER — Other Ambulatory Visit: Payer: Self-pay

## 2021-10-13 ENCOUNTER — Telehealth: Payer: Self-pay

## 2021-10-13 NOTE — Telephone Encounter (Signed)
Patient spouse called need med refill losartan (COZAAR) 100 MG tablet   Pharmacy  [facility], [place] [address]  Phone:  [phone]  Fax:  [phone]

## 2021-10-14 ENCOUNTER — Other Ambulatory Visit: Payer: Self-pay

## 2021-10-14 ENCOUNTER — Other Ambulatory Visit (HOSPITAL_COMMUNITY): Payer: Self-pay

## 2021-10-14 DIAGNOSIS — I10 Essential (primary) hypertension: Secondary | ICD-10-CM

## 2021-10-14 MED ORDER — LOSARTAN POTASSIUM 100 MG PO TABS
100.0000 mg | ORAL_TABLET | Freq: Every day | ORAL | 1 refills | Status: DC
  Filled 2021-10-14: qty 90, 90d supply, fill #0

## 2021-10-14 NOTE — Telephone Encounter (Signed)
Medication sent to pharmacy  

## 2021-10-20 ENCOUNTER — Other Ambulatory Visit (HOSPITAL_COMMUNITY): Payer: Self-pay

## 2021-10-21 ENCOUNTER — Other Ambulatory Visit (HOSPITAL_COMMUNITY): Payer: Self-pay

## 2021-10-21 MED ORDER — GUANFACINE HCL 1 MG PO TABS
ORAL_TABLET | ORAL | 0 refills | Status: DC
  Filled 2021-10-21: qty 30, 30d supply, fill #0

## 2021-10-21 MED ORDER — AMPHETAMINE-DEXTROAMPHETAMINE 20 MG PO TABS
ORAL_TABLET | ORAL | 0 refills | Status: DC
  Filled 2021-10-21: qty 45, 30d supply, fill #0

## 2021-10-28 ENCOUNTER — Other Ambulatory Visit (HOSPITAL_COMMUNITY): Payer: Self-pay

## 2021-10-28 MED ORDER — MYDAYIS 50 MG PO CP24
ORAL_CAPSULE | ORAL | 0 refills | Status: DC
  Filled 2021-10-28: qty 30, 30d supply, fill #0

## 2021-11-06 ENCOUNTER — Other Ambulatory Visit (HOSPITAL_COMMUNITY): Payer: Self-pay

## 2021-11-13 ENCOUNTER — Other Ambulatory Visit (HOSPITAL_COMMUNITY): Payer: Self-pay

## 2021-11-13 MED ORDER — GUANFACINE HCL 1 MG PO TABS
1.0000 mg | ORAL_TABLET | Freq: Every evening | ORAL | 5 refills | Status: DC
  Filled 2021-11-13: qty 30, 30d supply, fill #0
  Filled 2021-12-22: qty 30, 30d supply, fill #1

## 2021-11-14 ENCOUNTER — Other Ambulatory Visit (HOSPITAL_COMMUNITY): Payer: Self-pay

## 2021-11-20 ENCOUNTER — Other Ambulatory Visit (HOSPITAL_COMMUNITY): Payer: Self-pay

## 2021-11-20 MED ORDER — AMPHETAMINE-DEXTROAMPHETAMINE 10 MG PO TABS
ORAL_TABLET | ORAL | 0 refills | Status: DC
  Filled 2021-11-20: qty 45, 30d supply, fill #0
  Filled 2021-11-21: qty 90, 30d supply, fill #0

## 2021-11-21 ENCOUNTER — Other Ambulatory Visit (HOSPITAL_COMMUNITY): Payer: Self-pay

## 2021-11-27 ENCOUNTER — Other Ambulatory Visit (HOSPITAL_COMMUNITY): Payer: Self-pay

## 2021-11-27 MED ORDER — MYDAYIS 50 MG PO CP24
ORAL_CAPSULE | ORAL | 0 refills | Status: DC
  Filled 2021-11-27: qty 30, 30d supply, fill #0

## 2021-12-01 ENCOUNTER — Ambulatory Visit: Payer: No Typology Code available for payment source | Admitting: Internal Medicine

## 2021-12-07 ENCOUNTER — Other Ambulatory Visit (HOSPITAL_COMMUNITY): Payer: Self-pay

## 2021-12-07 MED ORDER — FLUOXETINE HCL 20 MG PO CAPS
ORAL_CAPSULE | ORAL | 5 refills | Status: DC
  Filled 2021-12-07: qty 30, 30d supply, fill #0

## 2021-12-22 ENCOUNTER — Other Ambulatory Visit (HOSPITAL_COMMUNITY): Payer: Self-pay

## 2021-12-23 ENCOUNTER — Other Ambulatory Visit (HOSPITAL_COMMUNITY): Payer: Self-pay

## 2021-12-23 MED ORDER — AMPHETAMINE-DEXTROAMPHETAMINE 20 MG PO TABS
ORAL_TABLET | ORAL | 0 refills | Status: DC
  Filled 2021-12-23: qty 45, 30d supply, fill #0

## 2021-12-23 MED ORDER — MYDAYIS 50 MG PO CP24
ORAL_CAPSULE | ORAL | 0 refills | Status: DC
  Filled 2021-12-25: qty 30, 30d supply, fill #0

## 2021-12-24 ENCOUNTER — Other Ambulatory Visit (HOSPITAL_COMMUNITY): Payer: Self-pay

## 2021-12-25 ENCOUNTER — Other Ambulatory Visit (HOSPITAL_COMMUNITY): Payer: Self-pay

## 2021-12-25 MED ORDER — AMPHETAMINE-DEXTROAMPHETAMINE 10 MG PO TABS
ORAL_TABLET | ORAL | 0 refills | Status: DC
  Filled 2021-12-25: qty 90, 30d supply, fill #0

## 2022-01-06 ENCOUNTER — Other Ambulatory Visit (HOSPITAL_COMMUNITY): Payer: Self-pay

## 2022-01-20 ENCOUNTER — Other Ambulatory Visit (HOSPITAL_COMMUNITY): Payer: Self-pay

## 2022-01-27 ENCOUNTER — Other Ambulatory Visit (HOSPITAL_COMMUNITY): Payer: Self-pay

## 2022-01-27 MED ORDER — AMPHETAMINE-DEXTROAMPHET ER 30 MG PO CP24
30.0000 mg | ORAL_CAPSULE | Freq: Every day | ORAL | 0 refills | Status: DC
Start: 2022-01-27 — End: 2022-03-18
  Filled 2022-01-27: qty 30, 30d supply, fill #0

## 2022-02-06 ENCOUNTER — Other Ambulatory Visit (HOSPITAL_COMMUNITY): Payer: Self-pay

## 2022-02-09 ENCOUNTER — Other Ambulatory Visit (HOSPITAL_COMMUNITY): Payer: Self-pay

## 2022-02-13 ENCOUNTER — Other Ambulatory Visit (HOSPITAL_COMMUNITY): Payer: Self-pay

## 2022-03-03 ENCOUNTER — Ambulatory Visit (INDEPENDENT_AMBULATORY_CARE_PROVIDER_SITE_OTHER): Payer: No Typology Code available for payment source | Admitting: Dermatology

## 2022-03-03 ENCOUNTER — Encounter: Payer: Self-pay | Admitting: Dermatology

## 2022-03-03 DIAGNOSIS — D1801 Hemangioma of skin and subcutaneous tissue: Secondary | ICD-10-CM | POA: Diagnosis not present

## 2022-03-03 DIAGNOSIS — D179 Benign lipomatous neoplasm, unspecified: Secondary | ICD-10-CM

## 2022-03-03 DIAGNOSIS — D1800 Hemangioma unspecified site: Secondary | ICD-10-CM

## 2022-03-03 DIAGNOSIS — Z1283 Encounter for screening for malignant neoplasm of skin: Secondary | ICD-10-CM | POA: Diagnosis not present

## 2022-03-03 DIAGNOSIS — L821 Other seborrheic keratosis: Secondary | ICD-10-CM

## 2022-03-03 NOTE — Patient Instructions (Signed)
Some recent onset scale with pinkness at the tops of the ears that fits seborrheic dermatitis, mild seborrheic otitis on the right ear.  He will look for a tube of ointment (not cream or lotion) 1% hydrocortisone and apply this nightly for 2 to 4 weeks.  If this clears, he may either stop the therapy or cut back on its use.  If this does not help he will contact me next month

## 2022-03-18 ENCOUNTER — Encounter: Payer: Self-pay | Admitting: Internal Medicine

## 2022-03-18 ENCOUNTER — Ambulatory Visit: Payer: No Typology Code available for payment source

## 2022-03-18 ENCOUNTER — Ambulatory Visit (INDEPENDENT_AMBULATORY_CARE_PROVIDER_SITE_OTHER): Payer: No Typology Code available for payment source

## 2022-03-18 ENCOUNTER — Ambulatory Visit (INDEPENDENT_AMBULATORY_CARE_PROVIDER_SITE_OTHER): Payer: No Typology Code available for payment source | Admitting: Internal Medicine

## 2022-03-18 VITALS — BP 122/80 | HR 80 | Temp 99.1°F | Ht 74.0 in | Wt 214.0 lb

## 2022-03-18 DIAGNOSIS — G8929 Other chronic pain: Secondary | ICD-10-CM | POA: Diagnosis not present

## 2022-03-18 DIAGNOSIS — M25511 Pain in right shoulder: Secondary | ICD-10-CM

## 2022-03-18 NOTE — Progress Notes (Unsigned)
Subjective:  Patient ID: Alejandro Johnson, male    DOB: July 03, 1981  Age: 41 y.o. MRN: [id number]  CC: Shoulder Pain   HPI Alejandro Johnson presents for f/up -  He complains of a 67-monthhistory of right shoulder pain.  He denies specific trauma or injury.  He does do repetitive activity playing basketball.  He notices the pain mostly with movement but pain is present during the day and night.  He is controlling the pain with Motrin and Tylenol.  History CMalihas a past medical history of ADHD, Allergy, Anxiety, Chronic prostatitis (07/08/2008), Depression, Depression, History of vasectomy (04/12/2019), and Hypertension.   He has a past surgical history that includes Vasectomy.   His family history includes Arthritis in his father; Hypertension in his mother; Mental illness in his maternal grandmother and mother.He reports that he quit smoking about 16 years ago. His smoking use included cigarettes. He has a 10.00 pack-year smoking history. He has never used smokeless tobacco. He reports that he does not currently use alcohol. He reports that he does not use drugs.  Outpatient Medications Prior to Visit  Medication Sig Dispense Refill   Amphet-Dextroamphet 3-Bead ER (MYDAYIS) 50 MG CP24 Take 1 capsule by mouth daily. 30 capsule 0   amphetamine-dextroamphetamine (ADDERALL) 20 MG tablet Take 1 and 1/2 tablets by mouth daily; take 1/2 tablet by mouth every morning and one tablet every afternoon 45 tablet 0   FLUoxetine (PROZAC) 20 MG capsule Take 1 capsule by mouth daily 30 capsule 5   guanFACINE (TENEX) 1 MG tablet Take 1 tablet (1 mg total) by mouth every evening. 30 tablet 5   loratadine (CLARITIN) 10 MG tablet Take 10 mg daily by mouth.     losartan (COZAAR) 100 MG tablet Take 1 tablet (100 mg total) by mouth daily. (Patient taking differently: Take 50 mg by mouth daily.) 90 tablet 1   spironolactone (ALDACTONE) 25 MG tablet Take 1 tablet (25 mg total) by mouth daily. 90 tablet 3    amphetamine-dextroamphetamine (ADDERALL XR) 30 MG 24 hr capsule Take 1 capsule (30 mg total) by mouth daily. 30 capsule 0   amphetamine-dextroamphetamine (ADDERALL) 10 MG tablet Take 1 tablet (10 mg total) by mouth in the morning AND 2 tablets (20 mg total) every evening. 90 tablet 0   buPROPion (WELLBUTRIN XL) 150 MG 24 hr tablet Take 1 tablet by mouth daily 30 tablet 2   buPROPion (WELLBUTRIN XL) 300 MG 24 hr tablet Take 1 tablet (300 mg total) by mouth daily. 30 tablet 6   No facility-administered medications prior to visit.    ROS Review of Systems  Constitutional: Negative.  Negative for diaphoresis and fatigue.  HENT: Negative.    Eyes: Negative.   Respiratory:  Negative for cough and shortness of breath.   Cardiovascular:  Negative for chest pain, palpitations and leg swelling.  Gastrointestinal:  Negative for abdominal pain, constipation and diarrhea.  Endocrine: Negative.   Genitourinary: Negative.   Musculoskeletal:  Positive for arthralgias. Negative for back pain, joint swelling, myalgias, neck pain and neck stiffness.  Neurological:  Negative for dizziness, weakness and light-headedness.  Hematological:  Negative for adenopathy. Does not bruise/bleed easily.  Psychiatric/Behavioral: Negative.    All other systems reviewed and are negative.   Objective:  BP 122/80 (BP Location: Left Arm, Patient Position: Sitting, Cuff Size: Large)   Pulse 80   Temp 99.1 F (37.3 C) (Oral)   Ht '6\' 2"'$  (1.88 m)   Wt 214 lb (97.1 kg)  SpO2 98%   BMI 27.48 kg/m   Physical Exam Vitals reviewed.  HENT:     Nose: Nose normal.     Mouth/Throat:     Mouth: Mucous membranes are moist.  Eyes:     General: No scleral icterus.    Conjunctiva/sclera: Conjunctivae normal.  Cardiovascular:     Rate and Rhythm: Normal rate and regular rhythm.     Heart sounds: No murmur heard. Pulmonary:     Effort: Pulmonary effort is normal.     Breath sounds: No stridor. No wheezing, rhonchi or rales.   Abdominal:     General: Abdomen is flat.     Palpations: There is no mass.     Tenderness: There is no abdominal tenderness. There is no guarding.     Hernia: No hernia is present.  Musculoskeletal:     Right shoulder: No swelling, deformity, effusion, tenderness, bony tenderness or crepitus. Decreased range of motion. Normal strength.     Left shoulder: Normal.     Cervical back: Neck supple.     Right lower leg: No edema.     Left lower leg: No edema.     Comments: There is some discomfort and decreased range of motion with internal and external rotation.  Lymphadenopathy:     Cervical: No cervical adenopathy.  Skin:    General: Skin is warm and dry.  Neurological:     General: No focal deficit present.  Psychiatric:        Mood and Affect: Mood normal.        Behavior: Behavior normal.     DG Shoulder Right  Result Date: 03/19/2022 CLINICAL DATA:  Right shoulder pain for six-months. EXAM: RIGHT SHOULDER - 2+ VIEW COMPARISON:  None Available. FINDINGS: There is no evidence of fracture or dislocation. There is no evidence of arthropathy or other focal bone abnormality. Soft tissues are unremarkable. IMPRESSION: Negative. Electronically Signed   By: Abelardo Diesel M.D.   On: 03/19/2022 14:52     Assessment & Plan:   Alejandro was seen today for shoulder pain.  Diagnoses and all orders for this visit:  Chronic right shoulder pain- The examination is suspicious for rotator cuff syndrome.  Plain films are negative for osteoarthritis.  Will refer for physical therapy if he is agreeable. -     DG Shoulder Right; Future   I have discontinued Alejandro Johnson's buPROPion and buPROPion. I am also having him maintain his loratadine, spironolactone, losartan, guanFACINE, FLUoxetine, amphetamine-dextroamphetamine, and Mydayis.  No orders of the defined types were placed in this encounter.    Follow-up: Return in about 3 months (around 06/18/2022).  Scarlette Calico, MD

## 2022-03-18 NOTE — Patient Instructions (Signed)

## 2022-03-30 ENCOUNTER — Encounter: Payer: Self-pay | Admitting: Dermatology

## 2022-03-30 NOTE — Progress Notes (Signed)
   New Patient   Subjective  Alejandro Johnson is a 41 y.o. male who presents for the following: Annual Exam (No personal history of skin caner atypia, patient stated he has 20+ liopma/cyst to look at today.).  General skin examination, history of multiple lipomas Location:  Duration:  Quality:  Associated Signs/Symptoms: Modifying Factors:  Severity:  Timing: Context:    The following portions of the chart were reviewed this encounter and updated as appropriate:  Tobacco  Allergies  Meds  Problems  Med Hx  Surg Hx  Fam Hx      Objective  Well appearing patient in no apparent distress; mood and affect are within normal limits. Waist up skin examination: No atypical pigmented lesions (all checked with dermoscopy, no nonmelanoma skin cancers  Several textured 3 to 5 mm brown flattopped papules, compatible dermoscopy  At least a dozen nontender doughy 5 to 15 mm subcutaneous nodules, scattered and asymmetric.  Multiple 1 mm smooth red dermal papules    All skin waist up examined.   Assessment & Plan  Encounter for screening for malignant neoplasm of skin  Encouraged to self examine twice annually, continue ultraviolet protection.  Seborrheic keratosis  Leave if stable  Lipoma, unspecified site  I discussed the benign nature of these common cysts along with the option of multiple surgical excisions.  No procedure scheduled.  Cherry angioma  No intervention necessary

## 2022-04-08 ENCOUNTER — Encounter: Payer: Self-pay | Admitting: Internal Medicine

## 2022-04-20 ENCOUNTER — Other Ambulatory Visit: Payer: Self-pay | Admitting: Internal Medicine

## 2022-04-20 DIAGNOSIS — I10 Essential (primary) hypertension: Secondary | ICD-10-CM

## 2022-04-20 MED ORDER — SPIRONOLACTONE 25 MG PO TABS: 25.0000 mg | ORAL_TABLET | Freq: Every day | ORAL | 0 refills | Status: DC

## 2022-05-20 ENCOUNTER — Encounter: Payer: Self-pay | Admitting: Internal Medicine

## 2022-05-20 ENCOUNTER — Other Ambulatory Visit: Payer: Self-pay | Admitting: Internal Medicine

## 2022-05-20 ENCOUNTER — Other Ambulatory Visit (INDEPENDENT_AMBULATORY_CARE_PROVIDER_SITE_OTHER): Payer: No Typology Code available for payment source

## 2022-05-20 DIAGNOSIS — I10 Essential (primary) hypertension: Secondary | ICD-10-CM | POA: Diagnosis not present

## 2022-05-20 LAB — BASIC METABOLIC PANEL
BUN: 18 mg/dL (ref 6–23)
CO2: 31 mEq/L (ref 19–32)
Calcium: 9.6 mg/dL (ref 8.4–10.5)
Chloride: 100 mEq/L (ref 96–112)
Creatinine, Ser: 1.48 mg/dL (ref 0.40–1.50)
GFR: 58 mL/min — ABNORMAL LOW (ref >60.00)
Glucose, Bld: 85 mg/dL (ref 70–99)
Potassium: 3.8 mEq/L (ref 3.5–5.1)
Sodium: 138 mEq/L (ref 135–145)

## 2022-05-20 MED ORDER — SPIRONOLACTONE 25 MG PO TABS: 25.0000 mg | ORAL_TABLET | Freq: Every day | ORAL | 0 refills | Status: DC

## 2022-05-20 MED ORDER — LOSARTAN POTASSIUM 100 MG PO TABS: 100.0000 mg | ORAL_TABLET | Freq: Every day | ORAL | 0 refills | Status: DC

## 2022-06-10 ENCOUNTER — Encounter: Payer: Self-pay | Admitting: Internal Medicine

## 2022-07-20 ENCOUNTER — Ambulatory Visit (INDEPENDENT_AMBULATORY_CARE_PROVIDER_SITE_OTHER): Payer: No Typology Code available for payment source

## 2022-07-20 ENCOUNTER — Encounter: Payer: Self-pay | Admitting: Internal Medicine

## 2022-07-20 ENCOUNTER — Ambulatory Visit (INDEPENDENT_AMBULATORY_CARE_PROVIDER_SITE_OTHER): Payer: No Typology Code available for payment source | Admitting: Internal Medicine

## 2022-07-20 VITALS — BP 138/86 | HR 81 | Temp 98.7°F | Ht 74.0 in | Wt 209.0 lb

## 2022-07-20 DIAGNOSIS — Z0000 Encounter for general adult medical examination without abnormal findings: Secondary | ICD-10-CM | POA: Diagnosis not present

## 2022-07-20 DIAGNOSIS — S59901A Unspecified injury of right elbow, initial encounter: Secondary | ICD-10-CM | POA: Insufficient documentation

## 2022-07-20 DIAGNOSIS — I10 Essential (primary) hypertension: Secondary | ICD-10-CM | POA: Insufficient documentation

## 2022-07-20 DIAGNOSIS — J4531 Mild persistent asthma with (acute) exacerbation: Secondary | ICD-10-CM

## 2022-07-20 DIAGNOSIS — E781 Pure hyperglyceridemia: Secondary | ICD-10-CM | POA: Diagnosis not present

## 2022-07-20 DIAGNOSIS — Z23 Encounter for immunization: Secondary | ICD-10-CM | POA: Diagnosis not present

## 2022-07-20 DIAGNOSIS — Z0001 Encounter for general adult medical examination with abnormal findings: Secondary | ICD-10-CM

## 2022-07-20 DIAGNOSIS — Z1159 Encounter for screening for other viral diseases: Secondary | ICD-10-CM | POA: Insufficient documentation

## 2022-07-20 DIAGNOSIS — J45909 Unspecified asthma, uncomplicated: Secondary | ICD-10-CM | POA: Insufficient documentation

## 2022-07-20 DIAGNOSIS — R052 Subacute cough: Secondary | ICD-10-CM

## 2022-07-20 LAB — CBC WITH DIFFERENTIAL/PLATELET
Basophils Absolute: 0.1 10*3/uL (ref 0.0–0.1)
Basophils Relative: 1 % (ref 0.0–3.0)
Eosinophils Absolute: 0.2 10*3/uL (ref 0.0–0.7)
Eosinophils Relative: 2.8 % (ref 0.0–5.0)
HCT: 40.8 % (ref 39.0–52.0)
Hemoglobin: 13.7 g/dL (ref 13.0–17.0)
Lymphocytes Relative: 21.1 % (ref 12.0–46.0)
Lymphs Abs: 1.7 10*3/uL (ref 0.7–4.0)
MCHC: 33.6 g/dL (ref 30.0–36.0)
MCV: 84.9 fl (ref 78.0–100.0)
Monocytes Absolute: 0.6 10*3/uL (ref 0.1–1.0)
Monocytes Relative: 7.2 % (ref 3.0–12.0)
Neutro Abs: 5.4 10*3/uL (ref 1.4–7.7)
Neutrophils Relative %: 67.9 % (ref 43.0–77.0)
Platelets: 325 10*3/uL (ref 150.0–400.0)
RBC: 4.8 Mil/uL (ref 4.22–5.81)
RDW: 13.5 % (ref 11.5–15.5)
WBC: 8 10*3/uL (ref 4.0–10.5)

## 2022-07-20 LAB — TSH: TSH: 0.89 u[IU]/mL (ref 0.35–5.50)

## 2022-07-20 LAB — LIPID PANEL
Cholesterol: 206 mg/dL — ABNORMAL HIGH (ref 0–200)
HDL: 45.3 mg/dL (ref >39.00)
NonHDL: 160.97
Total CHOL/HDL Ratio: 5
Triglycerides: 258 mg/dL — ABNORMAL HIGH (ref 0.0–149.0)
VLDL: 51.6 mg/dL — ABNORMAL HIGH (ref 0.0–40.0)

## 2022-07-20 LAB — LDL CHOLESTEROL, DIRECT: Direct LDL: 131 mg/dL

## 2022-07-20 MED ORDER — METHYLPREDNISOLONE 4 MG PO TBPK: ORAL_TABLET | ORAL | 0 refills | Status: AC

## 2022-07-20 MED ORDER — HYDROCODONE BIT-HOMATROP MBR 5-1.5 MG/5ML PO SOLN: 5.0000 mL | Freq: Three times a day (TID) | ORAL | 0 refills | Status: AC | PRN

## 2022-07-20 NOTE — Patient Instructions (Signed)

## 2022-07-20 NOTE — Progress Notes (Signed)
Subjective:  Patient ID: Alejandro Johnson, male    DOB: 20-Aug-1981  Age: 41 y.o. MRN: [id number]  CC: Annual Exam, Cough, Hypertension, and Hyperlipidemia   HPI Alejandro Johnson presents for a CPX and f/up -  He was diagnosed with COVID about 2 months ago and complains of persistent cough with intermittent wheezing and shortness of breath.  The cough is productive of clear phlegm.  He has not gotten any improvement with albuterol, Delsym, or Mucinex.  He plays basketball and has good exertion.  He denies dyspnea on exertion, diaphoresis, or edema.  He fell about 2 weeks ago and injured his right elbow.  He continues to complain of pain over the tip of the elbow but has no swelling or decreased range of motion.  Outpatient Medications Prior to Visit  Medication Sig Dispense Refill   Amphet-Dextroamphet 3-Bead ER (MYDAYIS) 50 MG CP24 Take 1 capsule by mouth daily. 30 capsule 0   amphetamine-dextroamphetamine (ADDERALL) 20 MG tablet Take 1 and 1/2 tablets by mouth daily; take 1/2 tablet by mouth every morning and one tablet every afternoon 45 tablet 0   FLUoxetine (PROZAC) 20 MG capsule Take 1 capsule by mouth daily 30 capsule 5   guanFACINE (TENEX) 1 MG tablet Take 1 tablet (1 mg total) by mouth every evening. 30 tablet 5   loratadine (CLARITIN) 10 MG tablet Take 10 mg daily by mouth.     losartan (COZAAR) 100 MG tablet Take 1 tablet (100 mg total) by mouth daily. 30 tablet 0   spironolactone (ALDACTONE) 25 MG tablet Take 1 tablet (25 mg total) by mouth daily. 30 tablet 0   No facility-administered medications prior to visit.    ROS Review of Systems  Constitutional: Negative.  Negative for chills, diaphoresis, fatigue and fever.  HENT: Negative.    Eyes: Negative.   Respiratory:  Positive for cough, shortness of breath and wheezing. Negative for chest tightness and stridor.   Cardiovascular:  Negative for chest pain, palpitations and leg swelling.  Gastrointestinal:  Negative for abdominal pain,  constipation, diarrhea, nausea and vomiting.  Endocrine: Negative.   Genitourinary: Negative.  Negative for difficulty urinating.  Musculoskeletal:  Positive for arthralgias. Negative for joint swelling.  Skin: Negative.  Negative for color change and pallor.  Neurological:  Negative for dizziness, weakness and light-headedness.  Hematological:  Negative for adenopathy. Does not bruise/bleed easily.  Psychiatric/Behavioral: Negative.      Objective:  BP 138/86 (BP Location: Left Arm, Patient Position: Sitting, Cuff Size: Large)   Pulse 81   Temp 98.7 F (37.1 C) (Oral)   Ht '6\' 2"'$  (1.88 m)   Wt 209 lb (94.8 kg)   SpO2 97%   BMI 26.83 kg/m   BP Readings from Last 3 Encounters:  07/20/22 138/86  03/18/22 122/80  08/21/21 (!) 143/86    Wt Readings from Last 3 Encounters:  07/20/22 209 lb (94.8 kg)  03/18/22 214 lb (97.1 kg)  08/21/21 225 lb 12.8 oz (102.4 kg)    Physical Exam Vitals reviewed.  HENT:     Nose: Nose normal.     Mouth/Throat:     Mouth: Mucous membranes are moist.  Eyes:     General: No scleral icterus.    Conjunctiva/sclera: Conjunctivae normal.  Cardiovascular:     Rate and Rhythm: Normal rate and regular rhythm.     Heart sounds: No murmur heard. Pulmonary:     Effort: Pulmonary effort is normal.     Breath sounds: No stridor. No  wheezing, rhonchi or rales.  Abdominal:     General: Abdomen is flat.     Palpations: There is no mass.     Tenderness: There is no abdominal tenderness. There is no guarding.     Hernia: No hernia is present.  Musculoskeletal:        General: Normal range of motion.     Right elbow: No swelling, deformity or effusion. Normal range of motion. Tenderness present in olecranon process. No radial head, medial epicondyle or lateral epicondyle tenderness.     Cervical back: Neck supple.     Right lower leg: No edema.     Left lower leg: No edema.  Lymphadenopathy:     Cervical: No cervical adenopathy.  Skin:    General:  Skin is warm.     Findings: No rash.  Neurological:     General: No focal deficit present.     Mental Status: He is alert.  Psychiatric:        Mood and Affect: Mood normal.        Behavior: Behavior normal.     Lab Results  Component Value Date   WBC 8.0 07/20/2022   HGB 13.7 07/20/2022   HCT 40.8 07/20/2022   PLT 325.0 07/20/2022   GLUCOSE 85 05/20/2022   CHOL 206 (H) 07/20/2022   TRIG 258.0 (H) 07/20/2022   HDL 45.30 07/20/2022   LDLDIRECT 131.0 07/20/2022   LDLCALC 90 02/06/2021   ALT 40 02/06/2021   AST 23 02/06/2021   NA 138 05/20/2022   K 3.8 05/20/2022   CL 100 05/20/2022   CREATININE 1.48 05/20/2022   BUN 18 05/20/2022   CO2 31 05/20/2022   TSH 0.89 07/20/2022    VAS US RENAL ARTERY DUPLEX  Result Date: 01/25/2020 ABDOMINAL VISCERAL Indications: Hypertension. Patient suddenly developed hypertension in February              of this year, was put on multiple med changes since then. R/O renal              artery stenosis. High Risk Factors: Hypertension, past history of smoking. Limitations: Air/bowel gas. Technically difficult study. Performing Technologist: Mariane Masters RVT  Examination Guidelines: A complete evaluation includes B-mode imaging, spectral Doppler, color Doppler, and power Doppler as needed of all accessible portions of each vessel. Bilateral testing is considered an integral part of a complete examination. Limited examinations for reoccurring indications may be performed as noted.  Duplex Findings: +--------------------+--------+--------+------+--------+ Mesenteric          PSV cm/sEDV cm/sPlaqueComments +--------------------+--------+--------+------+--------+ Aorta Prox            108                          +--------------------+--------+--------+------+--------+ Aorta Distal          104                          +--------------------+--------+--------+------+--------+ Celiac Artery Origin  187      58                   +--------------------+--------+--------+------+--------+ SMA Proximal           86      21                  +--------------------+--------+--------+------+--------+    +------------------+--------+--------+-------+ Right Renal ArteryPSV cm/sEDV cm/sComment +------------------+--------+--------+-------+ Origin  67      20           +------------------+--------+--------+-------+ Proximal            120      33           +------------------+--------+--------+-------+ Mid                  70      18           +------------------+--------+--------+-------+ Distal               64      17           +------------------+--------+--------+-------+ +-----------------+--------+--------+-------+ Left Renal ArteryPSV cm/sEDV cm/sComment +-----------------+--------+--------+-------+ Origin              60      15           +-----------------+--------+--------+-------+ Proximal            76      20           +-----------------+--------+--------+-------+ Mid                123      34           +-----------------+--------+--------+-------+ Distal              47      14           +-----------------+--------+--------+-------+  Technologist observations: Bilateral renal veins and IVC prox appear patent. +------------+--------+--------+----+-----------+--------+--------+----+ Right KidneyPSV cm/sEDV cm/sRI  Left KidneyPSV cm/sEDV cm/sRI   +------------+--------+--------+----+-----------+--------+--------+----+ Upper Pole  21      9       0.56Upper Pole 21      7       0.65 +------------+--------+--------+----+-----------+--------+--------+----+ Mid         26      11      0.59Mid        30      10      0.68 +------------+--------+--------+----+-----------+--------+--------+----+ Lower Pole  19      7       0.64Lower Pole 22      11      0.50 +------------+--------+--------+----+-----------+--------+--------+----+ Hilar       44       14      0.68Hilar      41      14      0.66 +------------+--------+--------+----+-----------+--------+--------+----+ +------------------+--------+------------------+--------+ Right Kidney              Left Kidney                +------------------+--------+------------------+--------+ RAR                       RAR                        +------------------+--------+------------------+--------+ RAR (manual)              RAR (manual)               +------------------+--------+------------------+--------+ Cortex                    Cortex                     +------------------+--------+------------------+--------+ Cortex thickness  12.00 mmCorex thickness   15.00 mm +------------------+--------+------------------+--------+ Kidney length (cm)10.30   Kidney length (cm)11.90    +------------------+--------+------------------+--------+  Summary: Renal:  Right: No evidence of right renal artery stenosis. RRV flow present.        Normal size right kidney. Normal right Resisitive Index.        Normal cortical thickness of right kidney. Left:  No evidence of left renal artery stenosis. LRV flow present.        Normal size of left kidney. Normal left Resistive Index.        Normal cortical thickness of the left kidney. Mesenteric: Normal Celiac artery and Superior Mesenteric artery findings. Areas of limited visceral study include mesenteric arteries.  *See table(s) above for measurements and observations.  Diagnosing physician: Quay Burow MD  Electronically signed by Quay Burow MD on 01/25/2020 at 12:31:55 PM.    Final    No results found.   Assessment & Plan:   Alejandro was seen today for annual exam, cough, hypertension and hyperlipidemia.  Diagnoses and all orders for this visit:  Injury of elbow, right, initial encounter- X-ray shows a calcification over the olecranon process.  He likely had a small avulsion fracture that has healed. -     DG Elbow Complete Right;  Future  Subacute cough- Chest x-ray is negative for mass or infiltrate. -     DG Chest 2 View; Future -     CBC with Differential/Platelet; Future -     HYDROcodone bit-homatropine (HYCODAN) 5-1.5 MG/5ML syrup; Take 5 mLs by mouth every 8 (eight) hours as needed for up to 8 days for cough. -     CBC with Differential/Platelet  Pure hypertriglyceridemia -     Lipid panel; Future -     Lipid panel  Hypertension, essential, benign- His blood pressure is adequately well controlled. -     TSH; Future -     CBC with Differential/Platelet; Future -     CBC with Differential/Platelet -     TSH  Encounter for general adult medical examination with abnormal findings- Exam completed, labs reviewed-statin is not indicated, vaccines reviewed and updated, patient education was given.  Mild persistent asthmatic bronchitis with acute exacerbation -     methylPREDNISolone (MEDROL DOSEPAK) 4 MG TBPK tablet; TAKE AS DIRECTED -     HYDROcodone bit-homatropine (HYCODAN) 5-1.5 MG/5ML syrup; Take 5 mLs by mouth every 8 (eight) hours as needed for up to 8 days for cough.  Need for hepatitis C screening test -     Hepatitis C antibody; Future -     Hepatitis C antibody  Other orders -     Flu Vaccine QUAD 6+ mos PF IM (Fluarix Quad PF) -     LDL cholesterol, direct   I am having Alejandro Johnson start on methylPREDNISolone and HYDROcodone bit-homatropine. I am also having him maintain his loratadine, guanFACINE, FLUoxetine, amphetamine-dextroamphetamine, Mydayis, losartan, and spironolactone.  Meds ordered this encounter  Medications   methylPREDNISolone (MEDROL DOSEPAK) 4 MG TBPK tablet    Sig: TAKE AS DIRECTED    Dispense:  21 tablet    Refill:  0   HYDROcodone bit-homatropine (HYCODAN) 5-1.5 MG/5ML syrup    Sig: Take 5 mLs by mouth every 8 (eight) hours as needed for up to 8 days for cough.    Dispense:  120 mL    Refill:  0     Follow-up: Return in about 6 months (around 01/18/2023).  Scarlette Calico, MD

## 2022-07-21 LAB — HEPATITIS C ANTIBODY: Hepatitis C Ab: NONREACTIVE

## 2022-08-13 DIAGNOSIS — Z419 Encounter for procedure for purposes other than remedying health state, unspecified: Secondary | ICD-10-CM | POA: Diagnosis not present

## 2022-09-13 DIAGNOSIS — Z419 Encounter for procedure for purposes other than remedying health state, unspecified: Secondary | ICD-10-CM | POA: Diagnosis not present

## 2022-09-17 ENCOUNTER — Other Ambulatory Visit: Payer: Self-pay | Admitting: Internal Medicine

## 2022-09-17 DIAGNOSIS — I10 Essential (primary) hypertension: Secondary | ICD-10-CM

## 2022-09-29 ENCOUNTER — Encounter: Payer: Self-pay | Admitting: Internal Medicine

## 2022-09-29 DIAGNOSIS — I10 Essential (primary) hypertension: Secondary | ICD-10-CM

## 2022-09-29 MED ORDER — SPIRONOLACTONE 25 MG PO TABS: 25.0000 mg | ORAL_TABLET | Freq: Every day | ORAL | 0 refills | Status: DC

## 2022-10-14 DIAGNOSIS — Z419 Encounter for procedure for purposes other than remedying health state, unspecified: Secondary | ICD-10-CM | POA: Diagnosis not present

## 2022-10-25 ENCOUNTER — Other Ambulatory Visit: Payer: Self-pay | Admitting: Internal Medicine

## 2022-10-25 DIAGNOSIS — I10 Essential (primary) hypertension: Secondary | ICD-10-CM

## 2022-11-12 DIAGNOSIS — Z419 Encounter for procedure for purposes other than remedying health state, unspecified: Secondary | ICD-10-CM | POA: Diagnosis not present

## 2022-12-13 ENCOUNTER — Other Ambulatory Visit: Payer: Self-pay | Admitting: Internal Medicine

## 2022-12-13 DIAGNOSIS — Z419 Encounter for procedure for purposes other than remedying health state, unspecified: Secondary | ICD-10-CM | POA: Diagnosis not present

## 2022-12-13 DIAGNOSIS — I10 Essential (primary) hypertension: Secondary | ICD-10-CM

## 2023-01-12 DIAGNOSIS — Z419 Encounter for procedure for purposes other than remedying health state, unspecified: Secondary | ICD-10-CM | POA: Diagnosis not present

## 2023-01-17 ENCOUNTER — Other Ambulatory Visit: Payer: Self-pay | Admitting: Internal Medicine

## 2023-01-17 DIAGNOSIS — I10 Essential (primary) hypertension: Secondary | ICD-10-CM

## 2023-01-18 ENCOUNTER — Ambulatory Visit: Payer: No Typology Code available for payment source | Admitting: Internal Medicine

## 2023-03-15 ENCOUNTER — Ambulatory Visit: Payer: No Typology Code available for payment source | Admitting: Internal Medicine

## 2023-03-15 VITALS — BP 118/82 | HR 82 | Temp 98.4°F | Ht 74.0 in | Wt 211.0 lb

## 2023-03-15 DIAGNOSIS — E559 Vitamin D deficiency, unspecified: Secondary | ICD-10-CM | POA: Diagnosis not present

## 2023-03-15 DIAGNOSIS — R5382 Chronic fatigue, unspecified: Secondary | ICD-10-CM

## 2023-03-15 DIAGNOSIS — E785 Hyperlipidemia, unspecified: Secondary | ICD-10-CM

## 2023-03-15 DIAGNOSIS — N528 Other male erectile dysfunction: Secondary | ICD-10-CM

## 2023-03-15 DIAGNOSIS — I10 Essential (primary) hypertension: Secondary | ICD-10-CM

## 2023-03-15 DIAGNOSIS — E781 Pure hyperglyceridemia: Secondary | ICD-10-CM | POA: Diagnosis not present

## 2023-03-15 DIAGNOSIS — R6882 Decreased libido: Secondary | ICD-10-CM

## 2023-03-15 LAB — CBC WITH DIFFERENTIAL/PLATELET
Basophils Absolute: 0.1 10*3/uL (ref 0.0–0.1)
Basophils Relative: 0.6 % (ref 0.0–3.0)
Eosinophils Absolute: 0.1 10*3/uL (ref 0.0–0.7)
Eosinophils Relative: 1.4 % (ref 0.0–5.0)
HCT: 43 % (ref 39.0–52.0)
Hemoglobin: 14.3 g/dL (ref 13.0–17.0)
Lymphocytes Relative: 19.5 % (ref 12.0–46.0)
Lymphs Abs: 1.6 10*3/uL (ref 0.7–4.0)
MCHC: 33.3 g/dL (ref 30.0–36.0)
MCV: 87 fl (ref 78.0–100.0)
Monocytes Absolute: 0.6 10*3/uL (ref 0.1–1.0)
Monocytes Relative: 6.9 % (ref 3.0–12.0)
Neutro Abs: 5.9 10*3/uL (ref 1.4–7.7)
Neutrophils Relative %: 71.6 % (ref 43.0–77.0)
Platelets: 347 10*3/uL (ref 150.0–400.0)
RBC: 4.94 Mil/uL (ref 4.22–5.81)
RDW: 13.2 % (ref 11.5–15.5)
WBC: 8.3 10*3/uL (ref 4.0–10.5)

## 2023-03-15 LAB — VITAMIN D 25 HYDROXY (VIT D DEFICIENCY, FRACTURES): VITD: 31 ng/mL (ref 30.00–100.00)

## 2023-03-15 LAB — BASIC METABOLIC PANEL
BUN: 16 mg/dL (ref 6–23)
CO2: 30 mEq/L (ref 19–32)
Calcium: 10.4 mg/dL (ref 8.4–10.5)
Chloride: 99 mEq/L (ref 96–112)
Creatinine, Ser: 1.4 mg/dL (ref 0.40–1.50)
GFR: 61.64 mL/min (ref >60.00)
Glucose, Bld: 97 mg/dL (ref 70–99)
Potassium: 4.3 mEq/L (ref 3.5–5.1)
Sodium: 138 mEq/L (ref 135–145)

## 2023-03-15 LAB — LIPID PANEL
Cholesterol: 219 mg/dL — ABNORMAL HIGH (ref 0–200)
HDL: 48.1 mg/dL (ref >39.00)
NonHDL: 170.86
Total CHOL/HDL Ratio: 5
Triglycerides: 318 mg/dL — ABNORMAL HIGH (ref 0.0–149.0)
VLDL: 63.6 mg/dL — ABNORMAL HIGH (ref 0.0–40.0)

## 2023-03-15 LAB — LDL CHOLESTEROL, DIRECT: Direct LDL: 133 mg/dL

## 2023-03-15 NOTE — Patient Instructions (Signed)
Hypertension, Adult High blood pressure (hypertension) is when the force of blood pumping through the arteries is too strong. The arteries are the blood vessels that carry blood from the heart throughout the body. Hypertension forces the heart to work harder to pump blood and may cause arteries to become narrow or stiff. Untreated or uncontrolled hypertension can lead to a heart attack, heart failure, a stroke, kidney disease, and other problems. A blood pressure reading consists of a higher number over a lower number. Ideally, your blood pressure should be below 120/80. The first ("top") number is called the systolic pressure. It is a measure of the pressure in your arteries as your heart beats. The second ("bottom") number is called the diastolic pressure. It is a measure of the pressure in your arteries as the heart relaxes. What are the causes? The exact cause of this condition is not known. There are some conditions that result in high blood pressure. What increases the risk? Certain factors may make you more likely to develop high blood pressure. Some of these risk factors are under your control, including: Smoking. Not getting enough exercise or physical activity. Being overweight. Having too much fat, sugar, calories, or salt (sodium) in your diet. Drinking too much alcohol. Other risk factors include: Having a personal history of heart disease, diabetes, high cholesterol, or kidney disease. Stress. Having a family history of high blood pressure and high cholesterol. Having obstructive sleep apnea. Age. The risk increases with age. What are the signs or symptoms? High blood pressure may not cause symptoms. Very high blood pressure (hypertensive crisis) may cause: Headache. Fast or irregular heartbeats (palpitations). Shortness of breath. Nosebleed. Nausea and vomiting. Vision changes. Severe chest pain, dizziness, and seizures. How is this diagnosed? This condition is diagnosed by  measuring your blood pressure while you are seated, with your arm resting on a flat surface, your legs uncrossed, and your feet flat on the floor. The cuff of the blood pressure monitor will be placed directly against the skin of your upper arm at the level of your heart. Blood pressure should be measured at least twice using the same arm. Certain conditions can cause a difference in blood pressure between your right and left arms. If you have a high blood pressure reading during one visit or you have normal blood pressure with other risk factors, you may be asked to: Return on a different day to have your blood pressure checked again. Monitor your blood pressure at home for 1 week or longer. If you are diagnosed with hypertension, you may have other blood or imaging tests to help your health care provider understand your overall risk for other conditions. How is this treated? This condition is treated by making healthy lifestyle changes, such as eating healthy foods, exercising more, and reducing your alcohol intake. You may be referred for counseling on a healthy diet and physical activity. Your health care provider may prescribe medicine if lifestyle changes are not enough to get your blood pressure under control and if: Your systolic blood pressure is above 130. Your diastolic blood pressure is above 80. Your personal target blood pressure may vary depending on your medical conditions, your age, and other factors. Follow these instructions at home: Eating and drinking  Eat a diet that is high in fiber and potassium, and low in sodium, added sugar, and fat. An example of this eating plan is called the DASH diet. DASH stands for Dietary Approaches to Stop Hypertension. To eat this way: Eat   plenty of fresh fruits and vegetables. Try to fill one half of your plate at each meal with fruits and vegetables. Eat whole grains, such as whole-wheat pasta, brown rice, or whole-grain bread. Fill about one  fourth of your plate with whole grains. Eat or drink low-fat dairy products, such as skim milk or low-fat yogurt. Avoid fatty cuts of meat, processed or cured meats, and poultry with skin. Fill about one fourth of your plate with lean proteins, such as fish, chicken without skin, beans, eggs, or tofu. Avoid pre-made and processed foods. These tend to be higher in sodium, added sugar, and fat. Reduce your daily sodium intake. Many people with hypertension should eat less than 1,500 mg of sodium a day. Do not drink alcohol if: Your health care provider tells you not to drink. You are pregnant, may be pregnant, or are planning to become pregnant. If you drink alcohol: Limit how much you have to: 0-1 drink a day for women. 0-2 drinks a day for men. Know how much alcohol is in your drink. In the [place] one drink equals one 12 oz bottle of beer (355 mL), one 5 oz glass of wine (148 mL), or one 1 oz glass of hard liquor (44 mL). Lifestyle  Work with your health care provider to maintain a healthy body weight or to lose weight. Ask what an ideal weight is for you. Get at least 30 minutes of exercise that causes your heart to beat faster (aerobic exercise) most days of the week. Activities may include walking, swimming, or biking. Include exercise to strengthen your muscles (resistance exercise), such as Pilates or lifting weights, as part of your weekly exercise routine. Try to do these types of exercises for 30 minutes at least 3 days a week. Do not use any products that contain nicotine or tobacco. These products include cigarettes, chewing tobacco, and vaping devices, such as e-cigarettes. If you need help quitting, ask your health care provider. Monitor your blood pressure at home as told by your health care provider. Keep all follow-up visits. This is important. Medicines Take over-the-counter and prescription medicines only as told by your health care provider. Follow directions carefully. Blood  pressure medicines must be taken as prescribed. Do not skip doses of blood pressure medicine. Doing this puts you at risk for problems and can make the medicine less effective. Ask your health care provider about side effects or reactions to medicines that you should watch for. Contact a health care provider if you: Think you are having a reaction to a medicine you are taking. Have headaches that keep coming back (recurring). Feel dizzy. Have swelling in your ankles. Have trouble with your vision. Get help right away if you: Develop a severe headache or confusion. Have unusual weakness or numbness. Feel faint. Have severe pain in your chest or abdomen. Vomit repeatedly. Have trouble breathing. These symptoms may be an emergency. Get help right away. Call 911. Do not wait to see if the symptoms will go away. Do not drive yourself to the hospital. Summary Hypertension is when the force of blood pumping through your arteries is too strong. If this condition is not controlled, it may put you at risk for serious complications. Your personal target blood pressure may vary depending on your medical conditions, your age, and other factors. For most people, a normal blood pressure is less than 120/80. Hypertension is treated with lifestyle changes, medicines, or a combination of both. Lifestyle changes include losing weight, eating a healthy,   low-sodium diet, exercising more, and limiting alcohol. This information is not intended to replace advice given to you by your health care provider. Make sure you discuss any questions you have with your health care provider. Document Revised: 07/07/2021 Document Reviewed: 07/07/2021 Elsevier Patient Education  2024 [employer]  

## 2023-03-15 NOTE — Progress Notes (Unsigned)
Subjective:  Patient ID: Alejandro Johnson, male    DOB: [birth year]  Age: 42 y.o. MRN: [id number]  CC: Hyperlipidemia and Hypertension   HPI Alejandro Johnson presents for f/up --  Discussed the use of AI scribe software for clinical note transcription with the patient, who gave verbal consent to proceed.  History of Present Illness   The patient presents with concerns about low energy and fatigue. He reports a history of low vitamin D levels, and has recently been self-medicating with Red Bull, which he notes improves his energy levels for the day. He attributes this to the B12 and B6 content, as he has also been taking B12 gummies which have had a similar effect. He has tried B6 supplements, but reports that these only made him feel sleepy.  In addition to his energy concerns, the patient also reports issues with weak erections and fluctuating libido. He has been using a mail order service for sildenafil, which he says helps with the erectile dysfunction.  The patient is physically active, regularly playing basketball and exercising. He reports heavy breathing and weakness in the legs when pushing himself during these activities, but denies any chest pain or shortness of breath. He also reports occasional lightheadedness during these activities, but is unsure if this is due to heat or dehydration.  The patient has been monitoring his blood pressure, which he reports has been consistently good. He denies any headaches, blurred vision, or swelling in the legs or feet. His weight has remained stable.       Outpatient Medications Prior to Visit  Medication Sig Dispense Refill   Amphet-Dextroamphet 3-Bead ER (MYDAYIS) 50 MG CP24 Take 1 capsule by mouth daily. 30 capsule 0   amphetamine-dextroamphetamine (ADDERALL) 20 MG tablet Take 1 and 1/2 tablets by mouth daily; take 1/2 tablet by mouth every morning and one tablet every afternoon 45 tablet 0   guanFACINE (TENEX) 1 MG tablet Take 1 tablet (1 mg total) by  mouth every evening. 30 tablet 5   loratadine (CLARITIN) 10 MG tablet Take 10 mg daily by mouth.     spironolactone (ALDACTONE) 25 MG tablet TAKE 1 TABLET BY MOUTH EVERY DAY 90 tablet 0   FLUoxetine (PROZAC) 20 MG capsule Take 1 capsule by mouth daily 30 capsule 5   losartan (COZAAR) 100 MG tablet TAKE 1 TABLET BY MOUTH EVERY DAY 90 tablet 0   No facility-administered medications prior to visit.    ROS Review of Systems  Constitutional:  Positive for fatigue. Negative for appetite change, diaphoresis and unexpected weight change.  HENT: Negative.    Eyes: Negative.   Respiratory:  Negative for apnea, cough, chest tightness and shortness of breath.   Cardiovascular:  Negative for chest pain, palpitations and leg swelling.  Gastrointestinal:  Negative for abdominal pain, constipation, diarrhea, nausea and vomiting.  Genitourinary: Negative.  Negative for difficulty urinating, dysuria, penile swelling, scrotal swelling and testicular pain.  Musculoskeletal: Negative.  Negative for arthralgias and myalgias.  Skin: Negative.  Negative for pallor and rash.  Neurological: Negative.  Negative for dizziness and weakness.  Hematological:  Negative for adenopathy. Does not bruise/bleed easily.  Psychiatric/Behavioral:  Negative for decreased concentration, dysphoric mood, sleep disturbance and suicidal ideas.     Objective:  BP 118/82 (BP Location: Right Arm, Patient Position: Sitting, Cuff Size: Normal)   Pulse 82   Temp 98.4 F (36.9 C) (Oral)   Ht 6\' 2"  (1.88 m)   Wt 211 lb (95.7 kg)   SpO2 97%  BMI 27.09 kg/m   BP Readings from Last 3 Encounters:  03/15/23 118/82  07/20/22 138/86  03/18/22 122/80    Wt Readings from Last 3 Encounters:  03/15/23 211 lb (95.7 kg)  07/20/22 209 lb (94.8 kg)  03/18/22 214 lb (97.1 kg)    Physical Exam Vitals reviewed.  HENT:     Nose: Nose normal.     Mouth/Throat:     Mouth: Mucous membranes are moist.  Eyes:     General: No scleral  icterus.       Left eye: No discharge.     Conjunctiva/sclera: Conjunctivae normal.  Cardiovascular:     Rate and Rhythm: Normal rate and regular rhythm.     Heart sounds: Normal heart sounds, S1 normal and S2 normal. No murmur heard.    Comments: EKG- NSR, 85 bpm LAE No LVH, Q waves, or ST/T wave changes Improved  Pulmonary:     Effort: Pulmonary effort is normal.     Breath sounds: No stridor. No wheezing, rhonchi or rales.  Abdominal:     General: Abdomen is flat.     Palpations: There is no mass.     Tenderness: There is no abdominal tenderness. There is no guarding.     Hernia: No hernia is present.  Musculoskeletal:     Cervical back: Neck supple.     Right lower leg: No edema.     Left lower leg: No edema.  Skin:    General: Skin is warm and dry.     Coloration: Skin is not pale.     Findings: No lesion.  Neurological:     General: No focal deficit present.     Mental Status: He is alert.     Deep Tendon Reflexes: Reflexes normal.  Psychiatric:        Mood and Affect: Mood normal.        Behavior: Behavior normal.     Lab Results  Component Value Date   WBC 8.3 03/15/2023   HGB 14.3 03/15/2023   HCT 43.0 03/15/2023   PLT 347.0 03/15/2023   GLUCOSE 97 03/15/2023   CHOL 219 (H) 03/15/2023   TRIG 318.0 (H) 03/15/2023   HDL 48.10 03/15/2023   LDLDIRECT 133.0 03/15/2023   LDLCALC 90 02/06/2021   ALT 40 02/06/2021   AST 23 02/06/2021   NA 138 03/15/2023   K 4.3 03/15/2023   CL 99 03/15/2023   CREATININE 1.40 03/15/2023   BUN 16 03/15/2023   CO2 30 03/15/2023   TSH 0.89 07/20/2022    VAS US RENAL ARTERY DUPLEX  Result Date: 01/25/2020 ABDOMINAL VISCERAL Indications: Hypertension. Patient suddenly developed hypertension in February              of this year, was put on multiple med changes since then. R/O renal              artery stenosis. High Risk Factors: Hypertension, past history of smoking. Limitations: Air/bowel gas. Technically difficult study.  Performing Technologist: Olegario Shearer RVT  Examination Guidelines: A complete evaluation includes B-mode imaging, spectral Doppler, color Doppler, and power Doppler as needed of all accessible portions of each vessel. Bilateral testing is considered an integral part of a complete examination. Limited examinations for reoccurring indications may be performed as noted.  Duplex Findings: +--------------------+--------+--------+------+--------+ Mesenteric          PSV cm/sEDV cm/sPlaqueComments +--------------------+--------+--------+------+--------+ Aorta Prox            108                          +--------------------+--------+--------+------+--------+  Aorta Distal          104                          +--------------------+--------+--------+------+--------+ Celiac Artery Origin  187      58                  +--------------------+--------+--------+------+--------+ SMA Proximal           86      21                  +--------------------+--------+--------+------+--------+    +------------------+--------+--------+-------+ Right Renal ArteryPSV cm/sEDV cm/sComment +------------------+--------+--------+-------+ Origin               67      20           +------------------+--------+--------+-------+ Proximal            120      33           +------------------+--------+--------+-------+ Mid                  70      18           +------------------+--------+--------+-------+ Distal               64      17           +------------------+--------+--------+-------+ +-----------------+--------+--------+-------+ Left Renal ArteryPSV cm/sEDV cm/sComment +-----------------+--------+--------+-------+ Origin              60      15           +-----------------+--------+--------+-------+ Proximal            76      20           +-----------------+--------+--------+-------+ Mid                123      34            +-----------------+--------+--------+-------+ Distal              47      14           +-----------------+--------+--------+-------+  Technologist observations: Bilateral renal veins and IVC prox appear patent. +------------+--------+--------+----+-----------+--------+--------+----+ Right KidneyPSV cm/sEDV cm/sRI  Left KidneyPSV cm/sEDV cm/sRI   +------------+--------+--------+----+-----------+--------+--------+----+ Upper Pole  21      9       0.56Upper Pole 21      7       0.65 +------------+--------+--------+----+-----------+--------+--------+----+ Mid         26      11      0.        30      10      0.68 +------------+--------+--------+----+-----------+--------+--------+----+ Lower Pole  19      7       0.64Lower Pole 22      11      0.50 +------------+--------+--------+----+-----------+--------+--------+----+ Hilar       44      14      0.68Hilar      41      14      0.66 +------------+--------+--------+----+-----------+--------+--------+----+ +------------------+--------+------------------+--------+ Right Kidney              Left Kidney                +------------------+--------+------------------+--------+ RAR  RAR                        +------------------+--------+------------------+--------+ RAR (manual)              RAR (manual)               +------------------+--------+------------------+--------+ Cortex                    Cortex                     +------------------+--------+------------------+--------+ Cortex thickness  12.00 mmCorex thickness   15.00 mm +------------------+--------+------------------+--------+ Kidney length (cm)10.30   Kidney length (cm)11.90    +------------------+--------+------------------+--------+  Summary: Renal:  Right: No evidence of right renal artery stenosis. RRV flow present.        Normal size right kidney. Normal right Resisitive Index.        Normal cortical  thickness of right kidney. Left:  No evidence of left renal artery stenosis. LRV flow present.        Normal size of left kidney. Normal left Resistive Index.        Normal cortical thickness of the left kidney. Mesenteric: Normal Celiac artery and Superior Mesenteric artery findings. Areas of limited visceral study include mesenteric arteries.  *See table(s) above for measurements and observations.  Diagnosing physician: Nanetta Batty MD  Electronically signed by Nanetta Batty MD on 01/25/2020 at 12:31:55 PM.    Final     Assessment & Plan:   Hypertension, essential, benign- His BP is over-controlled. Will discontinue the antihypertensives. -     Basic metabolic panel; Future -     CBC with Differential/Platelet; Future -     EKG 12-Lead  Pure hypertriglyceridemia -     Lipid panel; Future  Chronic fatigue- Labs are negative for secondary causes. -     Testosterone Total,Free,Bio, Males; Future -     Prolactin; Future -     VITAMIN D 25 Hydroxy (Vit-D Deficiency, Fractures); Future  Low libido -     Testosterone Total,Free,Bio, Males; Future -     Prolactin; Future -     VITAMIN D 25 Hydroxy (Vit-D Deficiency, Fractures); Future  Other male erectile dysfunction -     Testosterone Total,Free,Bio, Males; Future -     Prolactin; Future -     VITAMIN D 25 Hydroxy (Vit-D Deficiency, Fractures); Future  Vitamin D deficiency- Vit D is normal. -     VITAMIN D 25 Hydroxy (Vit-D Deficiency, Fractures); Future  Other orders -     LDL cholesterol, direct     Follow-up: Return in about 6 months (around 09/15/2023).  Sanda Linger, MD

## 2023-03-16 LAB — TESTOSTERONE TOTAL,FREE,BIO, MALES
Albumin: 5 g/dL (ref 3.6–5.1)
Sex Hormone Binding: 22 nmol/L (ref 10–50)
Testosterone, Bioavailable: 151.2 ng/dL (ref 110.0–575.0)
Testosterone, Free: 66.5 pg/mL (ref 46.0–224.0)
Testosterone: 389 ng/dL (ref 250–827)

## 2023-03-16 LAB — PROLACTIN: Prolactin: 3.5 ng/mL (ref 2.0–18.0)

## 2023-05-02 ENCOUNTER — Encounter: Payer: Self-pay | Admitting: Internal Medicine

## 2023-05-05 ENCOUNTER — Encounter: Payer: Self-pay | Admitting: Internal Medicine

## 2023-05-05 ENCOUNTER — Ambulatory Visit: Payer: No Typology Code available for payment source | Admitting: Internal Medicine

## 2023-05-05 ENCOUNTER — Ambulatory Visit (INDEPENDENT_AMBULATORY_CARE_PROVIDER_SITE_OTHER): Payer: No Typology Code available for payment source

## 2023-05-05 VITALS — BP 128/88 | HR 82 | Temp 98.4°F | Resp 16 | Ht 74.0 in | Wt 214.8 lb

## 2023-05-05 DIAGNOSIS — R6884 Jaw pain: Secondary | ICD-10-CM | POA: Insufficient documentation

## 2023-05-05 DIAGNOSIS — S0340XA Sprain of jaw, unspecified side, initial encounter: Secondary | ICD-10-CM | POA: Insufficient documentation

## 2023-05-05 MED ORDER — IBUPROFEN 600 MG PO TABS: 600.0000 mg | ORAL_TABLET | Freq: Three times a day (TID) | ORAL | 0 refills | Status: AC | PRN

## 2023-05-05 NOTE — Patient Instructions (Signed)
Temporomandibular Joint Syndrome  Temporomandibular joint syndrome (TMJ syndrome) is a condition that causes pain in the temporomandibular joints. These joints are located near your ears and allow your jaw to open and close. For people with TMJ syndrome, chewing, biting, or other movements of the jaw can be difficult or painful. TMJ syndrome is often mild and goes away within a few weeks. However, sometimes the condition becomes a long-term (chronic) problem. What are the causes? This condition may be caused by: Grinding your teeth or clenching your jaw. Some people do this when they are stressed. Arthritis. An injury to the jaw. A head or neck injury. Teeth or dentures that are not aligned well. In some cases, the cause of TMJ syndrome may not be known. What are the signs or symptoms? The most common symptom of this condition is aching pain on the side of the head in the area of the TMJ. Other symptoms may include: Pain when moving your jaw, such as when chewing or biting. Not being able to open your jaw all the way. Making a clicking sound when you open your mouth. Headache. Earache. Neck or shoulder pain. How is this diagnosed? This condition may be diagnosed based on: Your symptoms and medical history. A physical exam. Your health care provider may check the range of motion of your jaw. Imaging tests, such as X-rays or an MRI. You may also need to see your dentist, who will check if your teeth and jaw are lined up correctly. How is this treated? TMJ syndrome often goes away on its own. If treatment is needed, it may include: Eating soft foods and applying ice or heat. Medicines to relieve pain or inflammation. Medicines or massage to relax the muscles. A splint, bite plate, or mouthpiece to prevent teeth grinding or jaw clenching. Relaxation techniques or counseling to help reduce stress. A therapy for pain in which an electrical current is applied to the nerves through the skin  (transcutaneous electrical nerve stimulation). Acupuncture. This may help to relieve pain. Jaw surgery. This is rarely needed. Follow these instructions at home:  Eating and drinking Eat a soft diet if you are having trouble chewing. Avoid foods that require a lot of chewing. Do not chew gum. General instructions Take over-the-counter and prescription medicines only as told by your health care provider. If directed, put ice on the painful area. To do this: Put ice in a plastic bag. Place a towel between your skin and the bag. Leave the ice on for 20 minutes, 2-3 times a day. Remove the ice if your skin turns bright red. This is very important. If you cannot feel pain, heat, or cold, you have a greater risk of damage to the area. Apply a warm, wet cloth (warm compress) to the painful area as told. Massage your jaw area and do any jaw stretching exercises as told by your health care provider. If you were given a splint, bite plate, or mouthpiece, wear it as told by your health care provider. Keep all follow-up visits. This is important. Where to find more information [employer]: [employer] Contact a health care provider if: You have trouble eating. You have new or worsening symptoms. Get help right away if: Your jaw locks. Summary Temporomandibular joint syndrome (TMJ syndrome) is a condition that causes pain in the temporomandibular joints. These joints are located near your ears and allow your jaw to open and close. TMJ syndrome is often mild and goes away within  a few weeks. However, sometimes the condition becomes a long-term (chronic) problem. Symptoms include an aching pain on the side of the head in the area of the TMJ, pain when chewing or biting, and being unable to open your jaw all the way. You may also make a clicking sound when you open your mouth. TMJ syndrome often goes away on its own. If treatment is needed, it may  include medicines to relieve pain, reduce inflammation, or relax the muscles. A splint, bite plate, or mouthpiece may also be used to prevent teeth grinding or jaw clenching. This information is not intended to replace advice given to you by your health care provider. Make sure you discuss any questions you have with your health care provider. Document Revised: 04/12/2021 Document Reviewed: 04/12/2021 Elsevier Patient Education  2024 [employer].

## 2023-05-05 NOTE — Progress Notes (Signed)
Subjective:  Patient ID: Alejandro Johnson, male    DOB: Sep 18, 1980  Age: 42 y.o. MRN: [id number]  CC: Jaw Pain   HPI Alejandro Johnson presents for f/up ----  He complains of a several day history of discomfort in his right jaw.  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  He denies trauma or injury.  He said it hurts to open and close his mouth but he denies jaw claudication.  Outpatient Medications Prior to Visit  Medication Sig Dispense Refill   amphetamine-dextroamphetamine (ADDERALL) 20 MG tablet Take 1 and 1/2 tablets by mouth daily; take 1/2 tablet by mouth every morning and one tablet every afternoon 45 tablet 0   escitalopram (LEXAPRO) 10 MG tablet Take 10 mg by mouth daily.     guanFACINE (INTUNIV) 2 MG TB24 ER tablet Take 2 mg by mouth daily.     lisdexamfetamine (VYVANSE) 50 MG capsule Take 50 mg by mouth daily.     loratadine (CLARITIN) 10 MG tablet Take 10 mg daily by mouth.     Amphet-Dextroamphet 3-Bead ER (MYDAYIS) 50 MG CP24 Take 1 capsule by mouth daily. 30 capsule 0   guanFACINE (TENEX) 1 MG tablet Take 1 tablet (1 mg total) by mouth every evening. 30 tablet 5   No facility-administered medications prior to visit.    ROS Review of Systems  Constitutional: Negative.  Negative for diaphoresis and fatigue.  HENT:  Negative for congestion, dental problem, facial swelling, sinus pressure, sore throat, trouble swallowing and voice change.   Eyes: Negative.   Respiratory: Negative.  Negative for choking, shortness of breath and wheezing.   Cardiovascular:  Negative for chest pain, palpitations and leg swelling.  Gastrointestinal:  Negative for diarrhea and nausea.  Genitourinary: Negative.  Negative for difficulty urinating.  Musculoskeletal: Negative.   Skin: Negative.   Neurological: Negative.  Negative for dizziness and weakness.  Hematological:  Negative for adenopathy. Does not bruise/bleed easily.  Psychiatric/Behavioral: Negative.      Objective:  BP 128/88 (BP Location: Left Arm, Patient  Position: Sitting, Cuff Size: Normal)   Pulse 82   Temp 98.4 F (36.9 C) (Oral)   Resp 16   Ht 6\' 2"  (1.88 m)   Wt 214 lb 12.8 oz (97.4 kg)   SpO2 99%   BMI 27.58 kg/m   BP Readings from Last 3 Encounters:  05/05/23 128/88  03/15/23 118/82  07/20/22 138/86    Wt Readings from Last 3 Encounters:  05/05/23 214 lb 12.8 oz (97.4 kg)  03/15/23 211 lb (95.7 kg)  07/20/22 209 lb (94.8 kg)    Physical Exam Vitals reviewed.  HENT:     Nose: Nose normal.     Mouth/Throat:     Mouth: Mucous membranes are moist.     Dentition: No gum lesions.     Comments: Facial exam is normal. Eyes:     General: No scleral icterus.    Conjunctiva/sclera: Conjunctivae normal.  Cardiovascular:     Rate and Rhythm: Normal rate and regular rhythm.     Heart sounds: No murmur heard.    No gallop.  Pulmonary:     Effort: Pulmonary effort is normal.     Breath sounds: No stridor. No wheezing, rhonchi or rales.  Abdominal:     General: Abdomen is flat.     Palpations: There is no mass.     Tenderness: There is no abdominal tenderness. There is no guarding.     Hernia: No hernia is present.  Musculoskeletal:  General: Normal range of motion.     Cervical back: Neck supple.     Right lower leg: No edema.     Left lower leg: No edema.  Lymphadenopathy:     Cervical: No cervical adenopathy.  Skin:    General: Skin is warm and dry.     Findings: No rash.  Neurological:     General: No focal deficit present.     Mental Status: He is alert. Mental status is at baseline.  Psychiatric:        Mood and Affect: Mood normal.        Behavior: Behavior normal.     Lab Results  Component Value Date   WBC 8.3 03/15/2023   HGB 14.3 03/15/2023   HCT 43.0 03/15/2023   PLT 347.0 03/15/2023   GLUCOSE 97 03/15/2023   CHOL 219 (H) 03/15/2023   TRIG 318.0 (H) 03/15/2023   HDL 48.10 03/15/2023   LDLDIRECT 133.0 03/15/2023   LDLCALC 90 02/06/2021   ALT 40 02/06/2021   AST 23 02/06/2021   NA  138 03/15/2023   K 4.3 03/15/2023   CL 99 03/15/2023   CREATININE 1.40 03/15/2023   BUN 16 03/15/2023   CO2 30 03/15/2023   TSH 0.89 07/20/2022    VAS US RENAL ARTERY DUPLEX  Result Date: 01/25/2020 ABDOMINAL VISCERAL Indications: Hypertension. Patient suddenly developed hypertension in February              of this year, was put on multiple med changes since then. R/O renal              artery stenosis. High Risk Factors: Hypertension, past history of smoking. Limitations: Air/bowel gas. Technically difficult study. Performing Technologist: Olegario Shearer RVT  Examination Guidelines: A complete evaluation includes B-mode imaging, spectral Doppler, color Doppler, and power Doppler as needed of all accessible portions of each vessel. Bilateral testing is considered an integral part of a complete examination. Limited examinations for reoccurring indications may be performed as noted.  Duplex Findings: +--------------------+--------+--------+------+--------+ Mesenteric          PSV cm/sEDV cm/sPlaqueComments +--------------------+--------+--------+------+--------+ Aorta Prox            108                          +--------------------+--------+--------+------+--------+ Aorta Distal          104                          +--------------------+--------+--------+------+--------+ Celiac Artery Origin  187      58                  +--------------------+--------+--------+------+--------+ SMA Proximal           86      21                  +--------------------+--------+--------+------+--------+    +------------------+--------+--------+-------+ Right Renal ArteryPSV cm/sEDV cm/sComment +------------------+--------+--------+-------+ Origin               67      20           +------------------+--------+--------+-------+ Proximal            120      33           +------------------+--------+--------+-------+ Mid                  70  18            +------------------+--------+--------+-------+ Distal               64      17           +------------------+--------+--------+-------+ +-----------------+--------+--------+-------+ Left Renal ArteryPSV cm/sEDV cm/sComment +-----------------+--------+--------+-------+ Origin              60      15           +-----------------+--------+--------+-------+ Proximal            76      20           +-----------------+--------+--------+-------+ Mid                123      34           +-----------------+--------+--------+-------+ Distal              47      14           +-----------------+--------+--------+-------+  Technologist observations: Bilateral renal veins and IVC prox appear patent. +------------+--------+--------+----+-----------+--------+--------+----+ Right KidneyPSV cm/sEDV cm/sRI  Left KidneyPSV cm/sEDV cm/sRI   +------------+--------+--------+----+-----------+--------+--------+----+ Upper Pole  21      9       0.56Upper Pole 21      7       0.65 +------------+--------+--------+----+-----------+--------+--------+----+ Mid         26      11      0.        30      10      0.68 +------------+--------+--------+----+-----------+--------+--------+----+ Lower Pole  19      7       0.64Lower Pole 22      11      0.50 +------------+--------+--------+----+-----------+--------+--------+----+ Hilar       44      14      0.68Hilar      41      14      0.66 +------------+--------+--------+----+-----------+--------+--------+----+ +------------------+--------+------------------+--------+ Right Kidney              Left Kidney                +------------------+--------+------------------+--------+ RAR                       RAR                        +------------------+--------+------------------+--------+ RAR (manual)              RAR (manual)               +------------------+--------+------------------+--------+ Cortex                     Cortex                     +------------------+--------+------------------+--------+ Cortex thickness  12.00 mmCorex thickness   15.00 mm +------------------+--------+------------------+--------+ Kidney length (cm)10.30   Kidney length (cm)11.90    +------------------+--------+------------------+--------+  Summary: Renal:  Right: No evidence of right renal artery stenosis. RRV flow present.        Normal size right kidney. Normal right Resisitive Index.        Normal cortical thickness of right kidney. Left:  No evidence of left renal artery stenosis. LRV flow present.        Normal size of left kidney. Normal  left Resistive Index.        Normal cortical thickness of the left kidney. Mesenteric: Normal Celiac artery and Superior Mesenteric artery findings. Areas of limited visceral study include mesenteric arteries.  *See table(s) above for measurements and observations.  Diagnosing physician: Nanetta Batty MD  Electronically signed by Nanetta Batty MD on 01/25/2020 at 12:31:55 PM.    Final     DG Mandible 4 Views  Result Date: 05/05/2023 CLINICAL DATA:  right jaw pain for 3 days EXAM: MANDIBLE - 4+ VIEW COMPARISON:  None Available. FINDINGS: No acute displaced fracture. No aggressive osseous lesion. Normal alignment of bilateral temporomandibular joints. Soft tissues are within normal limits. No radiopaque foreign bodies. IMPRESSION: *No acute displaced fracture. If there is high clinical concern for fracture, CT is recommended. Electronically Signed   By: Jules Schick M.D.   On: 05/05/2023 09:58     Assessment & Plan:   Jaw pain- Exam and plain films are normal. 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 -     DG Mandible 4 Views; Future -     Ibuprofen; Take 1 tablet (600 mg total) by mouth every 8 (eight) hours as needed for up to 14 days.  Dispense: 30 tablet; Refill: 0  TMJ (sprain of temporomandibular joint), initial encounter -     Ibuprofen; Take 1 tablet (600 mg total) by mouth every 8  (eight) hours as needed for up to 14 days.  Dispense: 30 tablet; Refill: 0     Follow-up: Return in about 3 months (around 08/05/2023).  Sanda Linger, MD

## 2023-09-20 ENCOUNTER — Ambulatory Visit: Payer: No Typology Code available for payment source | Admitting: Internal Medicine

## 2024-01-16 ENCOUNTER — Encounter: Payer: Self-pay | Admitting: Internal Medicine

## 2024-01-23 ENCOUNTER — Ambulatory Visit: Admitting: Internal Medicine

## 2024-02-06 ENCOUNTER — Encounter: Payer: Self-pay | Admitting: Internal Medicine

## 2024-02-07 NOTE — Telephone Encounter (Signed)
He is overdue for an appt.

## 2024-02-13 ENCOUNTER — Ambulatory Visit: Admitting: Internal Medicine

## 2024-03-06 ENCOUNTER — Ambulatory Visit: Admitting: Internal Medicine

## 2024-03-27 ENCOUNTER — Ambulatory Visit: Admitting: Internal Medicine

## 2024-04-12 ENCOUNTER — Ambulatory Visit (INDEPENDENT_AMBULATORY_CARE_PROVIDER_SITE_OTHER): Admitting: Internal Medicine

## 2024-04-12 ENCOUNTER — Encounter: Payer: Self-pay | Admitting: Internal Medicine

## 2024-04-12 VITALS — BP 160/104 | HR 72 | Temp 98.7°F | Resp 16 | Ht 74.0 in | Wt 204.8 lb

## 2024-04-12 DIAGNOSIS — Z23 Encounter for immunization: Secondary | ICD-10-CM

## 2024-04-12 DIAGNOSIS — Z0001 Encounter for general adult medical examination with abnormal findings: Secondary | ICD-10-CM

## 2024-04-12 DIAGNOSIS — E781 Pure hyperglyceridemia: Secondary | ICD-10-CM | POA: Diagnosis not present

## 2024-04-12 DIAGNOSIS — I10 Essential (primary) hypertension: Secondary | ICD-10-CM

## 2024-04-12 LAB — LIPID PANEL
Cholesterol: 219 mg/dL — ABNORMAL HIGH (ref 0–200)
HDL: 53.9 mg/dL (ref >39.00)
LDL Cholesterol: 140 mg/dL — ABNORMAL HIGH (ref 0–99)
NonHDL: 164.88
Total CHOL/HDL Ratio: 4
Triglycerides: 125 mg/dL (ref 0.0–149.0)
VLDL: 25 mg/dL (ref 0.0–40.0)

## 2024-04-12 LAB — HEPATIC FUNCTION PANEL
ALT: 20 U/L (ref 0–53)
AST: 19 U/L (ref 0–37)
Albumin: 4.8 g/dL (ref 3.5–5.2)
Alkaline Phosphatase: 59 U/L (ref 39–117)
Bilirubin, Direct: 0.1 mg/dL (ref 0.0–0.3)
Total Bilirubin: 0.6 mg/dL (ref 0.2–1.2)
Total Protein: 7.7 g/dL (ref 6.0–8.3)

## 2024-04-12 LAB — URINALYSIS, ROUTINE W REFLEX MICROSCOPIC
Bilirubin Urine: NEGATIVE
Hgb urine dipstick: NEGATIVE
Ketones, ur: NEGATIVE
Leukocytes,Ua: NEGATIVE
Nitrite: NEGATIVE
RBC / HPF: NONE SEEN (ref 0–?)
Specific Gravity, Urine: 1.01 (ref 1.000–1.030)
Total Protein, Urine: NEGATIVE
Urine Glucose: NEGATIVE
Urobilinogen, UA: 0.2 (ref 0.0–1.0)
WBC, UA: NONE SEEN (ref 0–?)
pH: 6 (ref 5.0–8.0)

## 2024-04-12 LAB — BASIC METABOLIC PANEL WITH GFR
BUN: 13 mg/dL (ref 6–23)
CO2: 34 meq/L — ABNORMAL HIGH (ref 19–32)
Calcium: 9.5 mg/dL (ref 8.4–10.5)
Chloride: 98 meq/L (ref 96–112)
Creatinine, Ser: 1.25 mg/dL (ref 0.40–1.50)
GFR: 70.09 mL/min (ref >60.00)
Glucose, Bld: 97 mg/dL (ref 70–99)
Potassium: 3.7 meq/L (ref 3.5–5.1)
Sodium: 137 meq/L (ref 135–145)

## 2024-04-12 LAB — CBC WITH DIFFERENTIAL/PLATELET
Basophils Absolute: 0 K/uL (ref 0.0–0.1)
Basophils Relative: 0.7 % (ref 0.0–3.0)
Eosinophils Absolute: 0.1 K/uL (ref 0.0–0.7)
Eosinophils Relative: 1.9 % (ref 0.0–5.0)
HCT: 41.7 % (ref 39.0–52.0)
Hemoglobin: 14.2 g/dL (ref 13.0–17.0)
Lymphocytes Relative: 22.5 % (ref 12.0–46.0)
Lymphs Abs: 1.3 K/uL (ref 0.7–4.0)
MCHC: 34.1 g/dL (ref 30.0–36.0)
MCV: 84.4 fl (ref 78.0–100.0)
Monocytes Absolute: 0.4 K/uL (ref 0.1–1.0)
Monocytes Relative: 7.1 % (ref 3.0–12.0)
Neutro Abs: 3.8 K/uL (ref 1.4–7.7)
Neutrophils Relative %: 67.8 % (ref 43.0–77.0)
Platelets: 331 K/uL (ref 150.0–400.0)
RBC: 4.94 Mil/uL (ref 4.22–5.81)
RDW: 13.6 % (ref 11.5–15.5)
WBC: 5.6 K/uL (ref 4.0–10.5)

## 2024-04-12 LAB — TSH: TSH: 1.09 u[IU]/mL (ref 0.35–5.50)

## 2024-04-12 NOTE — Patient Instructions (Addendum)
 You received your first Hepatitis B vaccine today. Please return for the second dose in one month.    Health Maintenance, Male Adopting a healthy lifestyle and getting preventive care are important in promoting health and wellness. Ask your health care provider about: The right schedule for you to have regular tests and exams. Things you can do on your own to prevent diseases and keep yourself healthy. What should I know about diet, weight, and exercise? Eat a healthy diet  Eat a diet that includes plenty of vegetables, fruits, low-fat dairy products, and lean protein. Do not eat a lot of foods that are high in solid fats, added sugars, or sodium. Maintain a healthy weight Body mass index (BMI) is a measurement that can be used to identify possible weight problems. It estimates body fat based on height and weight. Your health care provider can help determine your BMI and help you achieve or maintain a healthy weight. Get regular exercise Get regular exercise. This is one of the most important things you can do for your health. Most adults should: Exercise for at least 150 minutes each week. The exercise should increase your heart rate and make you sweat (moderate-intensity exercise). Do strengthening exercises at least twice a week. This is in addition to the moderate-intensity exercise. Spend less time sitting. Even light physical activity can be beneficial. Watch cholesterol and blood lipids Have your blood tested for lipids and cholesterol at 44 years of age, then have this test every 5 years. You may need to have your cholesterol levels checked more often if: Your lipid or cholesterol levels are high. You are older than 44 years of age. You are at high risk for heart disease. What should I know about cancer screening? Many types of cancers can be detected early and may often be prevented. Depending on your health history and family history, you may need to have cancer screening at  various ages. This may include screening for: Colorectal cancer. Prostate cancer. Skin cancer. Lung cancer. What should I know about heart disease, diabetes, and high blood pressure? Blood pressure and heart disease High blood pressure causes heart disease and increases the risk of stroke. This is more likely to develop in people who have high blood pressure readings or are overweight. Talk with your health care provider about your target blood pressure readings. Have your blood pressure checked: Every 3-5 years if you are 55-60 years of age. Every year if you are 8 years old or older. If you are between the ages of 76 and 58 and are a current or former smoker, ask your health care provider if you should have a one-time screening for abdominal aortic aneurysm (AAA). Diabetes Have regular diabetes screenings. This checks your fasting blood sugar level. Have the screening done: Once every three years after age 76 if you are at a normal weight and have a low risk for diabetes. More often and at a younger age if you are overweight or have a high risk for diabetes. What should I know about preventing infection? Hepatitis B If you have a higher risk for hepatitis B, you should be screened for this virus. Talk with your health care provider to find out if you are at risk for hepatitis B infection. Hepatitis C Blood testing is recommended for: Everyone born from [birth year] through 1966. Anyone with known risk factors for hepatitis C. Sexually transmitted infections (STIs) You should be screened each year for STIs, including gonorrhea and chlamydia, if: You are  sexually active and are younger than 43 years of age. You are older than 44 years of age and your health care provider tells you that you are at risk for this type of infection. Your sexual activity has changed since you were last screened, and you are at increased risk for chlamydia or gonorrhea. Ask your health care provider if you are at  risk. Ask your health care provider about whether you are at high risk for HIV. Your health care provider may recommend a prescription medicine to help prevent HIV infection. If you choose to take medicine to prevent HIV, you should first get tested for HIV. You should then be tested every 3 months for as long as you are taking the medicine. Follow these instructions at home: Alcohol use Do not drink alcohol if your health care provider tells you not to drink. If you drink alcohol: Limit how much you have to 0-2 drinks a day. Know how much alcohol is in your drink. In the [place] one drink equals one 12 oz bottle of beer (355 mL), one 5 oz glass of wine (148 mL), or one 1 oz glass of hard liquor (44 mL). Lifestyle Do not use any products that contain nicotine or tobacco. These products include cigarettes, chewing tobacco, and vaping devices, such as e-cigarettes. If you need help quitting, ask your health care provider. Do not use street drugs. Do not share needles. Ask your health care provider for help if you need support or information about quitting drugs. General instructions Schedule regular health, dental, and eye exams. Stay current with your vaccines. Tell your health care provider if: You often feel depressed. You have ever been abused or do not feel safe at home. Summary Adopting a healthy lifestyle and getting preventive care are important in promoting health and wellness. Follow your health care provider's instructions about healthy diet, exercising, and getting tested or screened for diseases. Follow your health care provider's instructions on monitoring your cholesterol and blood pressure. This information is not intended to replace advice given to you by your health care provider. Make sure you discuss any questions you have with your health care provider. Document Revised: 01/19/2021 Document Reviewed: 01/19/2021 Elsevier Patient Education  2024 [employer].

## 2024-04-12 NOTE — Progress Notes (Signed)
 Subjective:  Patient ID: Alejandro Johnson, male    DOB: Dec 12, 1980  Age: 43 y.o. MRN: [id number]  CC: Annual Exam, Hypertension, and Hyperlipidemia   HPI Alejandro Johnson presents for a CPX and f/up ----  Discussed the use of AI scribe software for clinical note transcription with the patient, who gave verbal consent to proceed.  History of Present Illness Alejandro Johnson is a 43 year old male with hypertension who presents with elevated blood pressure.  His blood pressure was previously well-controlled, and he was taken off all previous medications. A year ago, it was 128/88. He has not been taking any new medications that could elevate his blood pressure, except for occasional Tylenol  and ibuprofen  for headaches.  He mentions a weight loss of about 15 pounds, attributed to increased physical activity and dietary changes, including fasting until 5 PM. He engages in activities like basketball, during which he experiences shortness of breath after about an hour, especially in hot weather. No chest pain, dizziness, or lightheadedness.  He is currently taking Vyvanse, Adderall, guanfacine , and fluoxetine . He reports no significant side effects from these medications, except for minor issues like dry mouth and talking too much. No insomnia, anxiety, or palpitations.  He has a blood pressure cuff at home and has taken losartan  in the past when he felt his blood pressure was rising. No recent nausea, vomiting, abdominal pain, or constipation.    Outpatient Medications Prior to Visit  Medication Sig Dispense Refill   amphetamine -dextroamphetamine  (ADDERALL) 20 MG tablet Take 20 mg by mouth daily.     guanFACINE  (INTUNIV ) 2 MG TB24 ER tablet Take 2 mg by mouth daily.     lisdexamfetamine (VYVANSE) 50 MG capsule Take 50 mg by mouth daily.     loratadine (CLARITIN) 10 MG tablet Take 10 mg daily by mouth.     escitalopram (LEXAPRO) 10 MG tablet Take 10 mg by mouth daily.     No facility-administered medications  prior to visit.    ROS Review of Systems  Constitutional:  Negative for appetite change, chills, diaphoresis, fatigue and fever.  HENT: Negative.    Respiratory: Negative.  Negative for cough, chest tightness, shortness of breath and wheezing.   Cardiovascular:  Negative for chest pain, palpitations and leg swelling.  Gastrointestinal:  Negative for abdominal pain, constipation, diarrhea, nausea and vomiting.  Genitourinary: Negative.  Negative for difficulty urinating, penile pain, penile swelling and scrotal swelling.  Musculoskeletal: Negative.  Negative for arthralgias and myalgias.  Skin: Negative.   Neurological: Negative.  Negative for dizziness, weakness and light-headedness.  Hematological:  Negative for adenopathy. Does not bruise/bleed easily.  Psychiatric/Behavioral:  Positive for decreased concentration. Negative for confusion and suicidal ideas. The patient is not nervous/anxious and is not hyperactive.     Objective:  BP (!) 160/104 (BP Location: Left Arm, Patient Position: Sitting, Cuff Size: Normal) Comment: BP (R) 156/100  Pulse 72   Temp 98.7 F (37.1 C) (Oral)   Resp 16   Ht 6' 2 (1.88 m)   Wt 204 lb 12.8 oz (92.9 kg)   SpO2 99%   BMI 26.29 kg/m   BP Readings from Last 3 Encounters:  04/12/24 (!) 160/104  05/05/23 128/88  03/15/23 118/82    Wt Readings from Last 3 Encounters:  04/12/24 204 lb 12.8 oz (92.9 kg)  05/05/23 214 lb 12.8 oz (97.4 kg)  03/15/23 211 lb (95.7 kg)    Physical Exam Vitals reviewed.  Constitutional:      Appearance: Normal appearance.  HENT:     Mouth/Throat:     Mouth: Mucous membranes are moist.  Eyes:     General: No scleral icterus.    Conjunctiva/sclera: Conjunctivae normal.  Cardiovascular:     Rate and Rhythm: Normal rate and regular rhythm.     Heart sounds: Normal heart sounds, S1 normal and S2 normal. Heart sounds not distant. No murmur heard.    No gallop.     Comments: EKG--- NSR, 60 bpm No LVH, Q waves,  or ST/T wave changes  Pulmonary:     Breath sounds: No stridor. No wheezing, rhonchi or rales.  Abdominal:     Palpations: There is no mass.     Tenderness: There is no abdominal tenderness. There is no guarding.     Hernia: No hernia is present.  Musculoskeletal:     Cervical back: Neck supple.     Right lower leg: No edema.     Left lower leg: No edema.  Lymphadenopathy:     Cervical: No cervical adenopathy.  Skin:    General: Skin is warm and dry.  Neurological:     General: No focal deficit present.     Mental Status: He is alert.  Psychiatric:        Mood and Affect: Mood normal.        Behavior: Behavior normal.        Thought Content: Thought content normal.        Judgment: Judgment normal.     Lab Results  Component Value Date   WBC 5.6 04/12/2024   HGB 14.2 04/12/2024   HCT 41.7 04/12/2024   PLT 331.0 04/12/2024   GLUCOSE 97 04/12/2024   CHOL 219 (H) 04/12/2024   TRIG 125.0 04/12/2024   HDL 53.90 04/12/2024   LDLDIRECT 133.0 03/15/2023   LDLCALC 140 (H) 04/12/2024   ALT 20 04/12/2024   AST 19 04/12/2024   NA 137 04/12/2024   K 3.7 04/12/2024   CL 98 04/12/2024   CREATININE 1.25 04/12/2024   BUN 13 04/12/2024   CO2 34 (H) 04/12/2024   TSH 1.09 04/12/2024    VAS US  RENAL ARTERY DUPLEX Result Date: 01/25/2020 ABDOMINAL VISCERAL Indications: Hypertension. Patient suddenly developed hypertension in February              of this year, was put on multiple med changes since then. R/O renal              artery stenosis. High Risk Factors: Hypertension, past history of smoking. Limitations: Air/bowel gas. Technically difficult study. Performing Technologist: Edsel Mustard RVT  Examination Guidelines: A complete evaluation includes B-mode imaging, spectral Doppler, color Doppler, and power Doppler as needed of all accessible portions of each vessel. Bilateral testing is considered an integral part of a complete examination. Limited examinations for reoccurring  indications may be performed as noted.  Duplex Findings: +--------------------+--------+--------+------+--------+ Mesenteric          PSV cm/sEDV cm/sPlaqueComments +--------------------+--------+--------+------+--------+ Aorta Prox            108                          +--------------------+--------+--------+------+--------+ Aorta Distal          104                          +--------------------+--------+--------+------+--------+ Celiac Artery Origin  187      58                  +--------------------+--------+--------+------+--------+  SMA Proximal           86      21                  +--------------------+--------+--------+------+--------+    +------------------+--------+--------+-------+ Right Renal ArteryPSV cm/sEDV cm/sComment +------------------+--------+--------+-------+ Origin               67      20           +------------------+--------+--------+-------+ Proximal            120      33           +------------------+--------+--------+-------+ Mid                  70      18           +------------------+--------+--------+-------+ Distal               64      17           +------------------+--------+--------+-------+ +-----------------+--------+--------+-------+ Left Renal ArteryPSV cm/sEDV cm/sComment +-----------------+--------+--------+-------+ Origin              60      15           +-----------------+--------+--------+-------+ Proximal            76      20           +-----------------+--------+--------+-------+ Mid                123      34           +-----------------+--------+--------+-------+ Distal              47      14           +-----------------+--------+--------+-------+  Technologist observations: Bilateral renal veins and IVC prox appear patent. +------------+--------+--------+----+-----------+--------+--------+----+ Right KidneyPSV cm/sEDV cm/sRI  Left KidneyPSV cm/sEDV cm/sRI    +------------+--------+--------+----+-----------+--------+--------+----+ Upper Pole  21      9       0.56Upper Pole 21      7       0.65 +------------+--------+--------+----+-----------+--------+--------+----+ Mid         26      11      0.        30      10      0.68 +------------+--------+--------+----+-----------+--------+--------+----+ Lower Pole  19      7       0.64Lower Pole 22      11      0.50 +------------+--------+--------+----+-----------+--------+--------+----+ Hilar       44      14      0.68Hilar      41      14      0.66 +------------+--------+--------+----+-----------+--------+--------+----+ +------------------+--------+------------------+--------+ Right Kidney              Left Kidney                +------------------+--------+------------------+--------+ RAR                       RAR                        +------------------+--------+------------------+--------+ RAR (manual)              RAR (manual)               +------------------+--------+------------------+--------+  Cortex                    Cortex                     +------------------+--------+------------------+--------+ Cortex thickness  12.00 mmCorex thickness   15.00 mm +------------------+--------+------------------+--------+ Kidney length (cm)10.30   Kidney length (cm)11.90    +------------------+--------+------------------+--------+  Summary: Renal:  Right: No evidence of right renal artery stenosis. RRV flow present.        Normal size right kidney. Normal right Resisitive Index.        Normal cortical thickness of right kidney. Left:  No evidence of left renal artery stenosis. LRV flow present.        Normal size of left kidney. Normal left Resistive Index.        Normal cortical thickness of the left kidney. Mesenteric: Normal Celiac artery and Superior Mesenteric artery findings. Areas of limited visceral study include mesenteric arteries.  *See table(s)  above for measurements and observations.  Diagnosing physician: Dorn Lesches MD  Electronically signed by Dorn Lesches MD on 01/25/2020 at 12:31:55 PM.    Final     Assessment & Plan:  Hypertension, unspecified type -     EKG 12-Lead  Encounter for general adult medical examination with abnormal findings- Exam completed, labs reviewed (statin is not indicated), vaccines reviewed and updated, no cancer screenings indicated, pt ed material was given.   Pure hypertriglyceridemia -     Lipid panel; Future -     Hepatic function panel; Future  Accelerated hypertension- EKG is negative for LVH. Labs are normal. Will evaluate for RAS and will start an ARB and thiazide diuretic. -     TSH; Future -     Urinalysis, Routine w reflex microscopic; Future -     Hepatic function panel; Future -     Aldosterone + renin activity w/ ratio; Future -     Basic metabolic panel with GFR; Future -     CBC with Differential/Platelet; Future -     US  RENAL ARTERY DUPLEX COMPLETE; Future -     Indapamide ; Take 1 tablet (1.25 mg total) by mouth daily.  Dispense: 90 tablet; Refill: 0 -     Olmesartan  Medoxomil; Take 1 tablet (20 mg total) by mouth daily.  Dispense: 90 tablet; Refill: 0  Immunization due -     Heplisav-B  (HepB-CPG) Vaccine     Follow-up: Return in about 3 months (around 07/13/2024).  Debby Molt, MD

## 2024-04-16 ENCOUNTER — Ambulatory Visit: Payer: Self-pay | Admitting: Internal Medicine

## 2024-04-16 DIAGNOSIS — Z23 Encounter for immunization: Secondary | ICD-10-CM | POA: Insufficient documentation

## 2024-04-16 DIAGNOSIS — I10 Essential (primary) hypertension: Secondary | ICD-10-CM | POA: Insufficient documentation

## 2024-04-16 LAB — ALDOSTERONE + RENIN ACTIVITY W/ RATIO
ALDO / PRA Ratio: 8.8 ratio (ref 0.9–28.9)
Aldosterone: 10 ng/dL
Renin Activity: 1.13 ng/mL/h (ref 0.25–5.82)

## 2024-04-16 MED ORDER — INDAPAMIDE 1.25 MG PO TABS: 1.2500 mg | ORAL_TABLET | Freq: Every day | ORAL | 0 refills | Status: DC

## 2024-04-16 MED ORDER — OLMESARTAN MEDOXOMIL 20 MG PO TABS: 20.0000 mg | ORAL_TABLET | Freq: Every day | ORAL | 0 refills | Status: DC

## 2024-05-13 ENCOUNTER — Encounter: Payer: Self-pay | Admitting: Internal Medicine

## 2024-05-21 ENCOUNTER — Ambulatory Visit
Admission: RE | Admit: 2024-05-21 | Discharge: 2024-05-21 | Disposition: A | Discharge status: Home / Self Care | Source: Ambulatory Visit | Attending: Internal Medicine | Admitting: Internal Medicine

## 2024-05-21 DIAGNOSIS — I10 Essential (primary) hypertension: Secondary | ICD-10-CM

## 2024-05-30 ENCOUNTER — Ambulatory Visit

## 2024-05-30 DIAGNOSIS — Z23 Encounter for immunization: Secondary | ICD-10-CM

## 2024-05-30 NOTE — Progress Notes (Signed)
 Pt received his Hep-B vaccine today and there were no issues or complications

## 2024-07-04 ENCOUNTER — Other Ambulatory Visit: Payer: Self-pay | Admitting: Internal Medicine

## 2024-07-04 DIAGNOSIS — I10 Essential (primary) hypertension: Secondary | ICD-10-CM

## 2024-07-28 ENCOUNTER — Encounter: Payer: Self-pay | Admitting: Internal Medicine

## 2024-08-03 ENCOUNTER — Other Ambulatory Visit: Payer: Self-pay | Admitting: Internal Medicine

## 2024-08-03 ENCOUNTER — Other Ambulatory Visit (HOSPITAL_COMMUNITY): Payer: Self-pay

## 2024-08-03 DIAGNOSIS — I10 Essential (primary) hypertension: Secondary | ICD-10-CM

## 2024-08-03 MED ORDER — OLMESARTAN MEDOXOMIL 20 MG PO TABS
20.0000 mg | ORAL_TABLET | Freq: Every day | ORAL | 0 refills | Status: DC
  Filled 2024-08-03: qty 14, 14d supply, fill #0

## 2024-08-03 MED ORDER — OLMESARTAN MEDOXOMIL 20 MG PO TABS: 20.0000 mg | ORAL_TABLET | Freq: Every day | ORAL | 0 refills | Status: AC

## 2024-08-06 ENCOUNTER — Other Ambulatory Visit (HOSPITAL_COMMUNITY): Payer: Self-pay

## 2024-08-06 MED ORDER — VYVANSE 70 MG PO CAPS
70.0000 mg | ORAL_CAPSULE | Freq: Every day | ORAL | 0 refills | Status: DC
  Filled 2024-08-06: qty 30, 30d supply, fill #0

## 2024-08-20 ENCOUNTER — Other Ambulatory Visit (HOSPITAL_COMMUNITY): Payer: Self-pay

## 2024-08-20 MED ORDER — AMPHETAMINE-DEXTROAMPHETAMINE 20 MG PO TABS
ORAL_TABLET | ORAL | 0 refills | Status: DC
  Filled 2024-08-20: qty 45, 30d supply, fill #0

## 2024-09-04 ENCOUNTER — Other Ambulatory Visit (HOSPITAL_COMMUNITY): Payer: Self-pay

## 2024-09-04 MED ORDER — VYVANSE 70 MG PO CAPS
70.0000 mg | ORAL_CAPSULE | Freq: Every day | ORAL | 0 refills | Status: DC
  Filled 2024-09-04: qty 30, 30d supply, fill #0

## 2024-09-20 ENCOUNTER — Other Ambulatory Visit (HOSPITAL_COMMUNITY): Payer: Self-pay

## 2024-09-20 MED ORDER — AMPHETAMINE-DEXTROAMPHETAMINE 20 MG PO TABS
30.0000 mg | ORAL_TABLET | Freq: Every day | ORAL | 0 refills | Status: AC
  Filled 2024-09-20: qty 45, 30d supply, fill #0

## 2024-09-23 ENCOUNTER — Encounter: Payer: Self-pay | Admitting: Internal Medicine

## 2024-09-26 ENCOUNTER — Other Ambulatory Visit: Payer: Self-pay

## 2024-09-26 ENCOUNTER — Other Ambulatory Visit (HOSPITAL_COMMUNITY): Payer: Self-pay

## 2024-09-26 DIAGNOSIS — I10 Essential (primary) hypertension: Secondary | ICD-10-CM

## 2024-09-26 MED ORDER — INDAPAMIDE 1.25 MG PO TABS
1.2500 mg | ORAL_TABLET | Freq: Every day | ORAL | 0 refills | Status: AC
  Filled 2024-09-26: qty 30, 30d supply, fill #0

## 2024-10-05 ENCOUNTER — Other Ambulatory Visit (HOSPITAL_COMMUNITY): Payer: Self-pay

## 2024-10-05 MED ORDER — LISDEXAMFETAMINE DIMESYLATE 70 MG PO CAPS
70.0000 mg | ORAL_CAPSULE | Freq: Every day | ORAL | 0 refills | Status: AC
  Filled 2024-10-05: qty 30, 30d supply, fill #0

## 2024-10-23 ENCOUNTER — Ambulatory Visit: Admitting: Internal Medicine
